# Patient Record
Sex: Male | Born: 1970
Health system: Southern US, Community
[De-identification: ages and names within clinical notes are randomized; demographics above are authoritative.]

---

## 2011-07-10 ENCOUNTER — Other Ambulatory Visit: Payer: Self-pay

## 2011-07-10 ENCOUNTER — Encounter: Payer: Self-pay | Admitting: *Deleted

## 2011-07-14 ENCOUNTER — Ambulatory Visit: Payer: Self-pay

## 2011-07-21 ENCOUNTER — Other Ambulatory Visit: Payer: Self-pay | Admitting: Lab

## 2011-07-21 ENCOUNTER — Ambulatory Visit: Payer: Self-pay | Admitting: Internal Medicine

## 2011-07-28 ENCOUNTER — Other Ambulatory Visit: Payer: Self-pay | Admitting: Lab

## 2011-07-29 ENCOUNTER — Telehealth: Payer: Self-pay | Admitting: *Deleted

## 2011-09-16 NOTE — Telephone Encounter (Signed)
Error, no note.

## 2011-12-24 ENCOUNTER — Other Ambulatory Visit: Payer: Self-pay | Admitting: *Deleted

## 2013-05-22 ENCOUNTER — Encounter (HOSPITAL_COMMUNITY): Payer: Self-pay | Admitting: Emergency Medicine

## 2013-05-22 ENCOUNTER — Emergency Department (INDEPENDENT_AMBULATORY_CARE_PROVIDER_SITE_OTHER)
Admission: EM | Admit: 2013-05-22 | Discharge: 2013-05-22 | Disposition: A | Discharge status: Home / Self Care | Payer: PRIVATE HEALTH INSURANCE | Source: Home / Self Care

## 2013-05-22 DIAGNOSIS — L508 Other urticaria: Secondary | ICD-10-CM

## 2013-05-22 MED ORDER — DEXAMETHASONE SODIUM PHOSPHATE 10 MG/ML IJ SOLN
INTRAMUSCULAR | Status: AC
  Filled 2013-05-22: qty 1

## 2013-05-22 MED ORDER — DEXAMETHASONE SODIUM PHOSPHATE 10 MG/ML IJ SOLN: 20.0000 mg | Freq: Once | INTRAMUSCULAR | Status: DC

## 2013-05-22 MED ORDER — PREDNISONE 10 MG PO TABS: ORAL_TABLET | ORAL | Status: DC

## 2013-05-22 MED ORDER — DEXAMETHASONE SODIUM PHOSPHATE 10 MG/ML IJ SOLN
10.0000 mg | Freq: Once | INTRAMUSCULAR | Status: AC
  Administered 2013-05-22: 10 mg via INTRAMUSCULAR

## 2013-05-22 NOTE — ED Provider Notes (Signed)
CSN: [id number]     Arrival date & time 05/22/13  1015 History   None    Chief Complaint  Patient presents with  . Rash   (Consider location/radiation/quality/duration/timing/severity/associated sxs/prior Treatment) HPI Comments: 42 yo male broke out with rash Friday after barber shop visit. Rash started on Right arm and has moved over most of his body. Denies SOB or difficulty swallowing. No relief with Benadryl, no other new exposures.   Patient is a 42 y.o. male presenting with rash.  Rash   History reviewed. No pertinent past medical history. History reviewed. No pertinent past surgical history. No family history on file. History  Substance Use Topics  . Smoking status: Never Smoker   . Smokeless tobacco: Not on file  . Alcohol Use: Yes    Review of Systems  Constitutional: Negative.   HENT: Negative.   Respiratory: Negative.   Cardiovascular: Negative.   Gastrointestinal: Negative.   Endocrine: Negative.   Genitourinary: Negative.   Musculoskeletal: Negative.   Skin: Positive for rash.  Allergic/Immunologic: Negative.   Psychiatric/Behavioral: Negative.     Allergies  Review of patient's allergies indicates no known allergies.  Home Medications   Current Outpatient Rx  Name  Route  Sig  Dispense  Refill  . diphenhydrAMINE (BENADRYL) 25 MG tablet   Oral   Take 25 mg by mouth every 6 (six) hours as needed for itching.         . predniSONE (DELTASONE) 10 MG tablet      1 po TID x 3 days 1 po BID x 3 days 1 po qd x 5 days   20 tablet   0    BP 145/81  Pulse 93  Temp(Src) 98.7 F (37.1 C) (Oral)  Resp 18  SpO2 94% Physical Exam  Nursing note and vitals reviewed. Constitutional: He appears well-developed and well-nourished.  Neck: Normal range of motion. Neck supple.  Cardiovascular: Normal rate, regular rhythm, normal heart sounds and intact distal pulses.   Pulmonary/Chest: Effort normal and breath sounds normal.  Skin: Skin is warm and dry.  Rash noted.  Multiple Urticaria appearing patches ranging form .5cm to 2cm over all extremities/ Abdomen/ chest/ back. Erythema with mild elevation at each location.    ED Course  Procedures (including critical care time) Labs Review Labs Reviewed - No data to display Imaging Review No results found.  MDM  Urticaria Dexamethasone 10 IM If symptoms do not resolve will start on 05/23/13 prednisone 10mg  DP AD Patient will continue either Benadryl OTC AD or start Zyrtec 10mg  AD. Patient will call 911 if symptoms increase. Advised of rash hygiene. Advised recheck of BP in 1 week if continues to be elevated needs rechecked at PCP and treated.   Berenice Primas, PA-C 05/22/13 1205  Berenice Primas, PA-C 05/22/13 1223

## 2013-05-22 NOTE — ED Notes (Signed)
Verified order with smith pa

## 2013-05-22 NOTE — ED Notes (Signed)
Rash since Friday, including extremities, red bumps that itch.  No one else at home with rash.

## 2013-05-24 NOTE — ED Provider Notes (Signed)
Medical screening examination/treatment/procedure(s) were performed by non-physician practitioner and as supervising physician I was immediately available for consultation/collaboration.   MORENO-COLL,Jaiyah Beining; MD  Jaquail Mclees Moreno-Coll, MD 05/24/13 0906 

## 2013-06-09 ENCOUNTER — Emergency Department (HOSPITAL_COMMUNITY): Payer: PRIVATE HEALTH INSURANCE | Admitting: Anesthesiology

## 2013-06-09 ENCOUNTER — Emergency Department (HOSPITAL_COMMUNITY)
Admission: EM | Admit: 2013-06-09 | Discharge: 2013-06-10 | Disposition: A | Discharge status: Home / Self Care | Payer: PRIVATE HEALTH INSURANCE | Attending: Emergency Medicine | Admitting: Emergency Medicine

## 2013-06-09 ENCOUNTER — Encounter (HOSPITAL_COMMUNITY)
Admission: EM | Disposition: A | Discharge status: Home / Self Care | Payer: Self-pay | Source: Home / Self Care | Attending: Emergency Medicine

## 2013-06-09 ENCOUNTER — Encounter (HOSPITAL_COMMUNITY): Payer: Self-pay | Admitting: Emergency Medicine

## 2013-06-09 ENCOUNTER — Encounter (HOSPITAL_COMMUNITY): Payer: PRIVATE HEALTH INSURANCE | Admitting: Anesthesiology

## 2013-06-09 DIAGNOSIS — S61012A Laceration without foreign body of left thumb without damage to nail, initial encounter: Secondary | ICD-10-CM

## 2013-06-09 DIAGNOSIS — W278XXA Contact with other nonpowered hand tool, initial encounter: Secondary | ICD-10-CM | POA: Insufficient documentation

## 2013-06-09 DIAGNOSIS — S61209A Unspecified open wound of unspecified finger without damage to nail, initial encounter: Secondary | ICD-10-CM | POA: Insufficient documentation

## 2013-06-09 DIAGNOSIS — I10 Essential (primary) hypertension: Secondary | ICD-10-CM | POA: Insufficient documentation

## 2013-06-09 HISTORY — PX: WOUND EXPLORATION: SHX6188

## 2013-06-09 HISTORY — PX: TENDON REPAIR: SHX5111

## 2013-06-09 LAB — COMPREHENSIVE METABOLIC PANEL
ALT: 32 U/L (ref 0–53)
AST: 25 U/L (ref 0–37)
Albumin: 4 g/dL (ref 3.5–5.2)
Alkaline Phosphatase: 85 U/L (ref 39–117)
BUN: 12 mg/dL (ref 6–23)
CO2: 24 mEq/L (ref 19–32)
Calcium: 8.9 mg/dL (ref 8.4–10.5)
Chloride: 103 mEq/L (ref 96–112)
Creatinine, Ser: 1.07 mg/dL (ref 0.50–1.35)
GFR calc Af Amer: >90 mL/min (ref >90)
GFR calc non Af Amer: 84 mL/min — ABNORMAL LOW (ref >90)
Glucose, Bld: 121 mg/dL — ABNORMAL HIGH (ref 70–99)
Potassium: 4.1 mEq/L (ref 3.5–5.1)
Sodium: 139 mEq/L (ref 135–145)
Total Bilirubin: 0.2 mg/dL — ABNORMAL LOW (ref 0.3–1.2)
Total Protein: 7.4 g/dL (ref 6.0–8.3)

## 2013-06-09 LAB — CBC
HCT: 42.1 % (ref 39.0–52.0)
Hemoglobin: 15.5 g/dL (ref 13.0–17.0)
MCH: 29.5 pg (ref 26.0–34.0)
MCHC: 36.8 g/dL — ABNORMAL HIGH (ref 30.0–36.0)
MCV: 80.2 fL (ref 78.0–100.0)
Platelets: 248 10*3/uL (ref 150–400)
RBC: 5.25 MIL/uL (ref 4.22–5.81)
RDW: 12.7 % (ref 11.5–15.5)
WBC: 8.7 10*3/uL (ref 4.0–10.5)

## 2013-06-09 SURGERY — TENDON REPAIR
Anesthesia: General | Site: Thumb | Laterality: Left | Wound class: Dirty or Infected

## 2013-06-09 MED ORDER — ONDANSETRON HCL 4 MG/2ML IJ SOLN
INTRAMUSCULAR | Status: DC | PRN
  Administered 2013-06-09: 4 mg via INTRAMUSCULAR

## 2013-06-09 MED ORDER — OXYCODONE-ACETAMINOPHEN 5-325 MG PO TABS: ORAL_TABLET | ORAL | Status: DC

## 2013-06-09 MED ORDER — LACTATED RINGERS IV SOLN
INTRAVENOUS | Status: DC | PRN
  Administered 2013-06-09: 22:00:00 via INTRAVENOUS

## 2013-06-09 MED ORDER — SULFAMETHOXAZOLE-TMP DS 800-160 MG PO TABS: 1.0000 | ORAL_TABLET | Freq: Two times a day (BID) | ORAL | Status: DC

## 2013-06-09 MED ORDER — BUPIVACAINE HCL (PF) 0.25 % IJ SOLN
INTRAMUSCULAR | Status: AC
  Filled 2013-06-09: qty 30

## 2013-06-09 MED ORDER — HYDROCODONE-ACETAMINOPHEN 5-325 MG PO TABS
1.0000 | ORAL_TABLET | Freq: Once | ORAL | Status: AC
  Administered 2013-06-09: 2 via ORAL
  Filled 2013-06-09: qty 2

## 2013-06-09 MED ORDER — CEFAZOLIN SODIUM-DEXTROSE 2-3 GM-% IV SOLR
INTRAVENOUS | Status: AC
  Filled 2013-06-09: qty 100

## 2013-06-09 MED ORDER — BUPIVACAINE HCL (PF) 0.25 % IJ SOLN
INTRAMUSCULAR | Status: DC | PRN
  Administered 2013-06-09: 30 mL

## 2013-06-09 MED ORDER — 0.9 % SODIUM CHLORIDE (POUR BTL) OPTIME
TOPICAL | Status: DC | PRN
  Administered 2013-06-09: 1000 mL

## 2013-06-09 MED ORDER — TETANUS-DIPHTH-ACELL PERTUSSIS 5-2.5-18.5 LF-MCG/0.5 IM SUSP
0.5000 mL | Freq: Once | INTRAMUSCULAR | Status: AC
  Administered 2013-06-09: 0.5 mL via INTRAMUSCULAR
  Filled 2013-06-09: qty 0.5

## 2013-06-09 MED ORDER — HYDROMORPHONE HCL PF 1 MG/ML IJ SOLN: 0.2500 mg | INTRAMUSCULAR | Status: DC | PRN

## 2013-06-09 MED ORDER — ARTIFICIAL TEARS OP OINT
TOPICAL_OINTMENT | OPHTHALMIC | Status: DC | PRN
  Administered 2013-06-09: 1 via OPHTHALMIC

## 2013-06-09 MED ORDER — MIDAZOLAM HCL 5 MG/5ML IJ SOLN
INTRAMUSCULAR | Status: DC | PRN
  Administered 2013-06-09: 2 mg via INTRAVENOUS

## 2013-06-09 MED ORDER — DEXTROSE 5 % IV SOLN
3.0000 g | INTRAVENOUS | Status: DC | PRN
  Administered 2013-06-09: 3 g via INTRAVENOUS

## 2013-06-09 MED ORDER — PROPOFOL 10 MG/ML IV BOLUS
INTRAVENOUS | Status: DC | PRN
  Administered 2013-06-09: 200 mg via INTRAVENOUS

## 2013-06-09 MED ORDER — FENTANYL CITRATE 0.05 MG/ML IJ SOLN
INTRAMUSCULAR | Status: DC | PRN
  Administered 2013-06-09: 50 ug via INTRAVENOUS
  Administered 2013-06-09 (×2): 100 ug via INTRAVENOUS

## 2013-06-09 MED ORDER — LIDOCAINE HCL (CARDIAC) 20 MG/ML IV SOLN
INTRAVENOUS | Status: DC | PRN
  Administered 2013-06-09: 60 mg via INTRAVENOUS

## 2013-06-09 MED ORDER — CEFAZOLIN SODIUM 1-5 GM-% IV SOLN
INTRAVENOUS | Status: AC
  Filled 2013-06-09: qty 50

## 2013-06-09 MED ORDER — SUCCINYLCHOLINE CHLORIDE 20 MG/ML IJ SOLN
INTRAMUSCULAR | Status: DC | PRN
  Administered 2013-06-09: 140 mg via INTRAVENOUS

## 2013-06-09 SURGICAL SUPPLY — 47 items
BANDAGE COBAN STERILE 2 (GAUZE/BANDAGES/DRESSINGS) IMPLANT
BANDAGE ELASTIC 3 VELCRO ST LF (GAUZE/BANDAGES/DRESSINGS) ×1 IMPLANT
BANDAGE GAUZE ELAST BULKY 4 IN (GAUZE/BANDAGES/DRESSINGS) ×1 IMPLANT
BLADE MINI RND TIP GREEN BEAV (BLADE) IMPLANT
BNDG CMPR 9X4 STRL LF SNTH (GAUZE/BANDAGES/DRESSINGS)
BNDG COHESIVE 1X5 TAN STRL LF (GAUZE/BANDAGES/DRESSINGS) IMPLANT
BNDG COHESIVE 3X5 TAN STRL LF (GAUZE/BANDAGES/DRESSINGS) IMPLANT
BNDG ESMARK 4X9 LF (GAUZE/BANDAGES/DRESSINGS) ×1 IMPLANT
CLOTH BEACON ORANGE TIMEOUT ST (SAFETY) ×1 IMPLANT
CORDS BIPOLAR (ELECTRODE) ×2 IMPLANT
CUFF TOURNIQUET SINGLE 18IN (TOURNIQUET CUFF) ×1 IMPLANT
CUFF TOURNIQUET SINGLE 24IN (TOURNIQUET CUFF) ×1 IMPLANT
DECANTER SPIKE VIAL GLASS SM (MISCELLANEOUS) IMPLANT
DRAPE OEC MINIVIEW 54X84 (DRAPES) IMPLANT
DRSG KUZMA FLUFF (GAUZE/BANDAGES/DRESSINGS) IMPLANT
DURAPREP 26ML APPLICATOR (WOUND CARE) ×1 IMPLANT
GAUZE SPONGE 2X2 8PLY STRL LF (GAUZE/BANDAGES/DRESSINGS) IMPLANT
GAUZE XEROFORM 1X8 LF (GAUZE/BANDAGES/DRESSINGS) ×2 IMPLANT
GLOVE BIO SURGEON STRL SZ7.5 (GLOVE) ×2 IMPLANT
GOWN BRE IMP PREV XXLGXLNG (GOWN DISPOSABLE) ×2 IMPLANT
GOWN PREVENTION PLUS XLARGE (GOWN DISPOSABLE) ×2 IMPLANT
GOWN STRL NON-REIN LRG LVL3 (GOWN DISPOSABLE) ×4 IMPLANT
KIT BASIN OR (CUSTOM PROCEDURE TRAY) ×2 IMPLANT
KIT ROOM TURNOVER OR (KITS) ×2 IMPLANT
MANIFOLD NEPTUNE II (INSTRUMENTS) ×1 IMPLANT
NDL HYPO 25GX1X1/2 BEV (NEEDLE) IMPLANT
NEEDLE HYPO 25GX1X1/2 BEV (NEEDLE) ×2 IMPLANT
NS IRRIG 1000ML POUR BTL (IV SOLUTION) ×2 IMPLANT
PACK ORTHO EXTREMITY (CUSTOM PROCEDURE TRAY) ×2 IMPLANT
PAD ARMBOARD 7.5X6 YLW CONV (MISCELLANEOUS) ×4 IMPLANT
PAD CAST 4YDX4 CTTN HI CHSV (CAST SUPPLIES) IMPLANT
PADDING CAST COTTON 4X4 STRL (CAST SUPPLIES)
RUBBERBAND STERILE (MISCELLANEOUS) ×1 IMPLANT
SPECIMEN JAR SMALL (MISCELLANEOUS) ×1 IMPLANT
SPONGE GAUZE 2X2 STER 10/PKG (GAUZE/BANDAGES/DRESSINGS)
SPONGE GAUZE 4X4 12PLY (GAUZE/BANDAGES/DRESSINGS) ×1 IMPLANT
SPONGE SCRUB IODOPHOR (GAUZE/BANDAGES/DRESSINGS) ×2 IMPLANT
SUT ETHIBOND 4 0 TF (SUTURE) ×1 IMPLANT
SUT ETHILON 5 0 PS 2 18 (SUTURE) IMPLANT
SUT SILK 4 0 PS 2 (SUTURE) IMPLANT
SUT VICRYL 4-0 PS2 18IN ABS (SUTURE) ×1 IMPLANT
SYR CONTROL 10ML LL (SYRINGE) ×1 IMPLANT
TOWEL OR 17X24 6PK STRL BLUE (TOWEL DISPOSABLE) ×2 IMPLANT
TOWEL OR 17X26 10 PK STRL BLUE (TOWEL DISPOSABLE) ×2 IMPLANT
TUBE FEEDING 5FR 15 INCH (TUBING) IMPLANT
UNDERPAD 30X30 INCONTINENT (UNDERPADS AND DIAPERS) ×2 IMPLANT
WATER STERILE IRR 1000ML POUR (IV SOLUTION) ×1 IMPLANT

## 2013-06-09 NOTE — ED Notes (Signed)
Pt presents to department for evaluation of L hand laceration. States he was using box cutter this afternoon when it slipped and cut hand. 2 inch laceration noted to L thumb. Bleeding controlled upon arrival to ED. 10/10 pain. Able to wiggle digits. Capillary refill less than 2 seconds. Tetanus unknown.

## 2013-06-09 NOTE — Anesthesia Preprocedure Evaluation (Addendum)
Anesthesia Evaluation  Patient identified by MRN, date of birth, ID band Patient awake  General Assessment Comment:1430  - chicken salad & 12oz tea.; 1830 @ 4oz water w/ vicodin  Reviewed: Allergy & Precautions, H&P , NPO status , Patient's Chart, lab work & pertinent test results, reviewed documented beta blocker date and time   History of Anesthesia Complications Negative for: history of anesthetic complications  Airway Mallampati: I TM Distance: >3 FB Neck ROM: Full    Dental  (+) Teeth Intact and Dental Advisory Given   Pulmonary neg pulmonary ROS,          Cardiovascular hypertension, Pt. on medications     Neuro/Psych negative neurological ROS     GI/Hepatic negative GI ROS, Neg liver ROS,   Endo/Other  Morbid obesity  Renal/GU negative Renal ROS     Musculoskeletal negative musculoskeletal ROS (+)   Abdominal   Peds  Hematology negative hematology ROS (+)   Anesthesia Other Findings   Reproductive/Obstetrics                         Anesthesia Physical Anesthesia Plan  ASA: III and emergent  Anesthesia Plan: General   Post-op Pain Management:    Induction: Intravenous  Airway Management Planned: Oral ETT  Additional Equipment:   Intra-op Plan:   Post-operative Plan: Extubation in OR  Informed Consent: I have reviewed the patients History and Physical, chart, labs and discussed the procedure including the risks, benefits and alternatives for the proposed anesthesia with the patient or authorized representative who has indicated his/her understanding and acceptance.   Dental advisory given  Plan Discussed with: Anesthesiologist, CRNA and Surgeon  Anesthesia Plan Comments:       Anesthesia Quick Evaluation

## 2013-06-09 NOTE — Op Note (Signed)
[id number] 

## 2013-06-09 NOTE — Preoperative (Signed)
Beta Blockers   Reason not to administer Beta Blockers:Not Applicable 

## 2013-06-09 NOTE — ED Notes (Signed)
Pt transported to OR Holding #[id number] via stretcher. Patient is gown and socks. Significant other remains with him and has his personal belongings.

## 2013-06-09 NOTE — Transfer of Care (Signed)
Immediate Anesthesia Transfer of Care Note  Patient: Derek Delacruz  Procedure(s) Performed: Procedure(s): Irrigation and debridement left thumb laceration (Left) WOUND EXPLORATION (Left)  Patient Location: PACU  Anesthesia Type:General  Level of Consciousness: sedated and patient cooperative  Airway & Oxygen Therapy: Patient Spontanous Breathing and Patient connected to nasal cannula oxygen  Post-op Assessment: Report given to PACU RN, Post -op Vital signs reviewed and stable and Patient moving all extremities X 4  Post vital signs: Reviewed and stable  Complications: No apparent anesthesia complications

## 2013-06-09 NOTE — ED Notes (Signed)
Laceration cleaned and covered with dressing.  

## 2013-06-09 NOTE — Anesthesia Postprocedure Evaluation (Signed)
  Anesthesia Post-op Note  Patient: Derek Delacruz  Procedure(s) Performed: Procedure(s): Irrigation and debridement left thumb laceration (Left) WOUND EXPLORATION (Left)  Patient Location: PACU  Anesthesia Type:General  Level of Consciousness: awake  Airway and Oxygen Therapy: Patient Spontanous Breathing  Post-op Pain: mild  Post-op Assessment: Post-op Vital signs reviewed  Post-op Vital Signs: Reviewed  Complications: No apparent anesthesia complications

## 2013-06-09 NOTE — ED Notes (Signed)
Dr. Merlyn Lot (ortho/hand) at bedside and has decided to take the patient to the OR to repair laceration

## 2013-06-09 NOTE — ED Provider Notes (Signed)
CSN: [id number]     Arrival date & time 06/09/13  1719 History  This chart was scribed for non-physician practitioner Coral Ceo, PA-C, working with Richardean Canal, MD by Dorothey Baseman, ED Scribe. This patient was seen in room TR06C/TR06C and the patient's care was started at [facility].    Chief Complaint  Patient presents with  . Laceration   The history is provided by the patient. No language interpreter was used.   HPI Comments: Derek Delacruz is a 42 y.o. male with no past medical history who presents to the Emergency Department complaining of a laceration to the left thumb, which occurred around 2 hours ago. Patient states that his hand slipped and he cut his left thumb using a box cutter. He denies any other injuries. Patient denies any foreign bodies. Patient has not done anything to clean the wound out prior to arrival. Patient reports associated pain to the area that is 10/10 currently. He reports that he has not taken anything at home to treat the pain. The bleeding is well-controlled at this time. He denies any numbness, tingling, loss of sensation or weakness to the area. Patient reports that he does not know when his last tetanus vaccination was. Patient is right-handed.    No past medical history on file. No past surgical history on file. History reviewed. No pertinent family history. History  Substance Use Topics  . Smoking status: Never Smoker   . Smokeless tobacco: Not on file  . Alcohol Use: Yes     Comment: social    Review of Systems  Constitutional: Negative for fever, diaphoresis, activity change, appetite change and fatigue.  HENT: Negative for drooling and mouth sores.   Eyes: Negative for visual disturbance.  Respiratory: Negative for cough and shortness of breath.   Cardiovascular: Negative for chest pain.  Gastrointestinal: Negative for nausea, vomiting and abdominal pain.  Genitourinary: Negative for dysuria.  Musculoskeletal: Negative for joint swelling.   Skin: Positive for wound. Negative for color change.  Neurological: Negative for dizziness, weakness, numbness and headaches.    Allergies  Review of patient's allergies indicates no known allergies.  Home Medications   Current Outpatient Rx  Name  Route  Sig  Dispense  Refill  . diphenhydrAMINE (BENADRYL) 25 MG tablet   Oral   Take 25 mg by mouth every 6 (six) hours as needed for itching.         . predniSONE (DELTASONE) 10 MG tablet      1 po TID x 3 days 1 po BID x 3 days 1 po qd x 5 days   20 tablet   0    Triage Vitals: BP 161/65  Pulse 81  Temp(Src) 98.1 F (36.7 C) (Oral)  Resp 18  SpO2 100%  Filed Vitals:   06/09/13 1736 06/09/13 2330 06/09/13 2345 06/10/13 0000  BP: 161/65 162/97 152/99 166/89  Pulse: 81 83 82 80  Temp: 98.1 F (36.7 C) 97 F (36.1 C)  98.2 F (36.8 C)  TempSrc: Oral     Resp: 18 12    SpO2: 100% 100% 100% 100%   Physical Exam  Nursing note and vitals reviewed. Constitutional: He is oriented to person, place, and time. He appears well-developed and well-nourished. No distress.  HENT:  Head: Normocephalic and atraumatic.  Right Ear: External ear normal.  Left Ear: External ear normal.  Nose: Nose normal.  Mouth/Throat: Oropharynx is clear and moist. No oropharyngeal exudate.  Eyes: Conjunctivae are normal. Right eye  exhibits no discharge. Left eye exhibits no discharge.  Neck: Normal range of motion. Neck supple.  Cardiovascular: Normal rate, regular rhythm and normal heart sounds.  Exam reveals no gallop and no friction rub.   No murmur heard. Radial pulses present and equal bilaterally. Capillary refill less than 2 seconds to left first phalanx.  Pulmonary/Chest: Effort normal and breath sounds normal. No respiratory distress. He has no wheezes. He has no rales. He exhibits no tenderness.  Abdominal: Soft. He exhibits no distension. There is no tenderness.  Musculoskeletal: Normal range of motion. He exhibits tenderness. He  exhibits no edema.  Diffuse tenderness to palpation to the left first phalanx throughout. Patient unable to fully flex the metacarpophalangeal or interphalangeal joint of the left first phalanx, however does have some range of motion with flexion. Patient able to fully extend MP and IP joints of left first phalanx. No limitations with range of motion of the joints of the digits 2-5 of the left hand.   Neurological: He is alert and oriented to person, place, and time.  Gross sensation intact in the upper extremities bilaterally  Skin: Skin is warm and dry. He is not diaphoretic.  5 cm linear laceration to the dorsum of the left thumb overlying the metacarpal phalangeal joint.  Laceration does not extend to the IP joint. No active bleeding. No surrounding edema or ecchymosis. No evidence of foreign body. Flexor tendon is exposed and appears to be partially lacerated however intact.   Psychiatric: He has a normal mood and affect. His behavior is normal.    ED Course  Procedures (including critical care time)  Medications  TDaP (BOOSTRIX) injection 0.5 mL (0.5 mLs Intramuscular Given 06/09/13 1823)  HYDROcodone-acetaminophen (NORCO/VICODIN) 5-325 MG per tablet 1-2 tablet (2 tablets Oral Given 06/09/13 1823)    DIAGNOSTIC STUDIES: Oxygen Saturation is 100% on room air, normal by my interpretation.    COORDINATION OF CARE: 6:14PM- Will order a tetanus vaccination and pain medication. Discussed treatment plan with patient at bedside and patient verbalized agreement.   8:17PM- Consulted with hand surgeon, Dr. Merlyn Lot, and we both agreed that the patient will require surgery. Discussed treatment plan with patient at bedside and patient verbalized agreement.    Labs Review Labs Reviewed - No data to display Imaging Review No results found.  EKG Interpretation   None      Results for orders placed during the hospital encounter of 06/09/13  CBC      Result Value Range   WBC 8.7  4.0 - 10.5  K/uL   RBC 5.25  4.22 - 5.81 MIL/uL   Hemoglobin 15.5  13.0 - 17.0 g/dL   HCT 16.1  09.6 - 04.5 %   MCV 80.2  78.0 - 100.0 fL   MCH 29.5  26.0 - 34.0 pg   MCHC 36.8 (*) 30.0 - 36.0 g/dL   RDW 40.9  81.1 - 91.4 %   Platelets 248  150 - 400 K/uL  COMPREHENSIVE METABOLIC PANEL      Result Value Range   Sodium 139  135 - 145 mEq/L   Potassium 4.1  3.5 - 5.1 mEq/L   Chloride 103  96 - 112 mEq/L   CO2 24  19 - 32 mEq/L   Glucose, Bld 121 (*) 70 - 99 mg/dL   BUN 12  6 - 23 mg/dL   Creatinine, Ser 7.82  0.50 - 1.35 mg/dL   Calcium 8.9  8.4 - 95.6 mg/dL   Total Protein 7.4  6.0 - 8.3 g/dL   Albumin 4.0  3.5 - 5.2 g/dL   AST 25  0 - 37 U/L   ALT 32  0 - 53 U/L   Alkaline Phosphatase 85  39 - 117 U/L   Total Bilirubin 0.2 (*) 0.3 - 1.2 mg/dL   GFR calc non Af Amer 84 (*) >90 mL/min   GFR calc Af Amer >90  >90 mL/min     MDM   1. Laceration of thumb, left, with tendon involvement, initial encounter     Derek Delacruz is a 42 y.o. male with no past medical history who presents to the Emergency Department complaining of a laceration to the left thumb, which occurred around 2 hours ago.  Vicodin ordered for pain. Tetanus booster given.    Consults  8:03 PM = Spoke with Dr. Merlyn Lot who is coming to evaluate the patient. CBC and CMP ordered.    Patient was evaluated in emergency department for a laceration. Hand surgery was consult in who evaluated the patient in the emergency room. Dr. Merlyn Lot with hand surgery felt surgical washout and further exploration was necessary. Patient was taken to the OR for further evaluation and management. Patient was neurovascularly intact, however did have some limitations with range of motion. There was possible evidence of tendon involvement on exam. Patient was in agreement with plan.    Final impressions: 1. Laceration of left thumb with tendon involvement.    Luiz Iron PA-C   This patient was discussed with Dr. Silverio Lay  I personally  performed the services described in this documentation, which was scribed in my presence. The recorded information has been reviewed and is accurate.        Jillyn Ledger, PA-C 06/10/13 [phone]

## 2013-06-09 NOTE — H&P (Signed)
Derek Delacruz is an 42 y.o. male.   Chief Complaint: left thumb laceration HPI: 42 yo rhd male states he lacerated left thumb with a box cutter this afternoon.  Seen at [facility] and felt to have tendon injury.  Reports no previous injury to thumb and no other injury at this time.  No past medical history on file.  No past surgical history on file.  History reviewed. No pertinent family history. Social History:  reports that he has never smoked. He does not have any smokeless tobacco history on file. He reports that he drinks alcohol. He reports that he does not use illicit drugs.  Allergies: No Known Allergies   (Not in a hospital admission)  No results found for this or any previous visit (from the past 48 hour(s)).  No results found.   A comprehensive review of systems was negative.  Blood pressure 161/65, pulse 81, temperature 98.1 F (36.7 C), temperature source Oral, resp. rate 18, SpO2 100.00%.  General appearance: alert, cooperative and appears stated age Head: Normocephalic, without obvious abnormality, atraumatic Neck: supple, symmetrical, trachea midline Resp: clear to auscultation bilaterally Cardio: regular rate and rhythm GI: non tender Extremities: light touch sensation and capillary refill intact all digits.  +epl/fpl/io.  laceration dorsally over mp joint left thumb.  visible tendon.  longitudinal laceration.   Pulses: 2+ and symmetric Skin: Skin color, texture, turgor normal. No rashes or lesions Neurologic: Grossly normal Incision/Wound: As above  Assessment/Plan Left thumb laceration.  Non operative and operative treatment options were discussed with the patient and patient wishes to proceed with operative treatment. Decision made for exploration of wound in OR with repair of tendon and irrigation of joint as necessary.  Risks, benefits, and alternatives of surgery were discussed and the patient agrees with the plan of care.   Derek Delacruz R 06/09/2013, 8:33  PM

## 2013-06-09 NOTE — Brief Op Note (Signed)
06/09/2013  11:21 PM  PATIENT:  Derek Delacruz  42 y.o. male  PRE-OPERATIVE DIAGNOSIS:  Left Thumb Laceration  POST-OPERATIVE DIAGNOSIS:  Left Thumb Laceration  PROCEDURE:  Procedure(s): Irrigation and debridement left thumb laceration WOUND EXPLORATION  SURGEON:  Surgeon(s): Tami Ribas, MD  PHYSICIAN ASSISTANT:   ASSISTANTS: none   ANESTHESIA:   general  EBL:  Total I/O In: 700 [I.V.:700] Out: -   DRAINS: none   LOCAL MEDICATIONS USED:  MARCAINE     SPECIMEN:  No Specimen  DISPOSITION OF SPECIMEN:  N/A  COUNTS:  YES  TOURNIQUET:  27 minutes at 250 mmHg  DICTATION: .Other Dictation: Dictation Number [id number]  PLAN OF CARE: Discharge to home after PACU

## 2013-06-10 ENCOUNTER — Encounter (HOSPITAL_COMMUNITY): Payer: Self-pay | Admitting: Orthopedic Surgery

## 2013-06-10 NOTE — Op Note (Signed)
NAME:  SOHAN, POTVIN NO.:  [id number]  MEDICAL RECORD NO.:  [id number]  LOCATION:  [facility]                         FACILITY:  [facility]  PHYSICIAN:  Betha Loa, MD        DATE OF BIRTH:  07/02/1971  DATE OF PROCEDURE:  06/09/2013 DATE OF DISCHARGE:  06/10/2013                              OPERATIVE REPORT   PREOPERATIVE DIAGNOSIS:  Left thumb laceration with possible extensor tendon laceration.  POSTOPERATIVE DIAGNOSIS:  Left thumb laceration with extensor pollicis longus longitudinal laceration.  PROCEDURES: 1. Left thumb exploration of traumatic wound. 2. Irrigation and debridement of wound and metacarpophalangeal joint. 3. Repair of extensor pollicis longus tendon.  SURGEON:  Betha Loa, MD  ASSISTANT:  None.  ANESTHESIA:  General.  IV FLUIDS:  Per Anesthesia flow sheet.  ESTIMATED BLOOD LOSS:  Minimal.  COMPLICATIONS:  None.  SPECIMENS:  None.  TOURNIQUET TIME:  27 minutes.  DISPOSITION:  Stable to PACU.  INDICATIONS:  Mr. Derek Delacruz is a 42 year old right-hand dominant male who states he lacerated his left thumb with a box cutter earlier this evening.  He presented to [facility] Emergency Department where he was evaluated.  He was felt to have an extensor tendon laceration.  I was consulted for management of injury.  On examination, he had intact sensation and capillary refill in his thumb tip.  He can flex and extend the IP joint of thumb.  He had longitudinal laceration over the MP joint and metacarpal of the thumb.  I discussed with Mr. Lienhard the nature of the injury.  I recommended going to the operating room for irrigation and debridement of the wound with exploration, possible tendon repair as necessary.  Risks, benefits, and alternatives of surgery were discussed including risk of blood loss; infection; damage to nerves, vessels, tendons, ligaments, bone; failure of surgery; need for additional surgery; complications with the  wound healing; continued pain; and stiffness.  He voiced understanding of these risks and elected to proceed.  OPERATIVE COURSE:  After being identified preoperatively by myself, the patient and I agreed upon procedure and site of the procedure.  Surgical site was marked.  The risks, benefits, and alternatives of surgery were reviewed and he wished to proceed.  Surgical consent had been signed. He was given IV Ancef as preoperative antibiotic prophylaxis.  He was transported to the operating room and placed on the operating room table in supine position with the left upper extremity on an arm board. General anesthesia was induced by anesthesiologist.  Left upper extremity was prepped and draped in normal sterile orthopedic fashion. Surgical pause was performed between surgeons, Anesthesia, and operating staff, and all were in agreement as to the patient, procedure, and site of the procedure.  Tourniquet at the proximal aspect of the extremity was inflated to 250 mmHg after exsanguination of the limb with an Esmarch bandage.  The wound was explored.  Bipolar electrocautery was used to obtain hemostasis.  There was a longitudinal laceration along the radial border of the EPL tendon.  This allowed it to subluxate ulnarly.  There was laceration through the periosteum over the metacarpal.  The MP joint was exposed.  There was no  gross contamination.  The wound was copiously irrigated with 1000 mL of sterile saline by bulb syringe.  An Angiocath needle was used to irrigate the MP joint.  A 4-0 Vicryl suture was used in a running fashion to repair the periosteum over top of the metacarpal.  A 4-0 Ethibond suture was used in a running fashion to repair the EPL tendon.  There was a communication between the EPB and EPL tendon which was repaired as well.  The skin was closed with 4-0 nylon in a horizontal mattress fashion.  The wound was injected with 6 mL of 0.25% plain Marcaine to aid in  postoperative analgesia.  It was then dressed with sterile Xeroform, 4x4s, and wrapped with Kerlix.  A thumb spica splint was placed and wrapped with Kerlix and Ace bandage. Tourniquet was deflated at 27 minutes.  Fingertips were pink with brisk capillary refill after deflation of tourniquet.  Operative drapes were broken down and the patient was awoken from anesthesia safely.  He was transferred back to the stretcher and taken to the PACU in stable condition.  I will see him back in the office in 1 week for postoperative followup.  I will give him Percocet 5/325, 1 to 2 p.o. q.6 hours p.r.n. pain, dispensed #40, and Bactrim DS 1 p.o. b.i.d. x7 days to cover for the open wound.     Betha Loa, MD     KK/MEDQ  D:  06/09/2013  T:  06/10/2013  Job:  [id number]

## 2013-06-10 NOTE — ED Provider Notes (Signed)
Medical screening examination/treatment/procedure(s) were conducted as a shared visit with non-physician practitioner(s) and myself.  I personally evaluated the patient during the encounter  Derek Delacruz is a 42 y.o. male here with L thumb injury with box cutter. Tetanus given in ED. After irrigation, I examined the wound and saw some tendon injury along the lateral aspect of the thumb. He is able to adduct the thumb with some difficulty. Dr. Merlyn Lot called by PA and took him to the OR for wash out and tendon repair.    Richardean Canal, MD 06/10/13 [phone]

## 2014-04-28 ENCOUNTER — Other Ambulatory Visit: Payer: Self-pay | Admitting: Family Medicine

## 2014-04-28 DIAGNOSIS — M25561 Pain in right knee: Secondary | ICD-10-CM

## 2014-05-02 ENCOUNTER — Ambulatory Visit
Admission: RE | Admit: 2014-05-02 | Discharge: 2014-05-02 | Disposition: A | Discharge status: Home / Self Care | Payer: BC Managed Care – PPO | Source: Ambulatory Visit | Attending: Family Medicine | Admitting: Family Medicine

## 2014-05-02 DIAGNOSIS — M25561 Pain in right knee: Secondary | ICD-10-CM

## 2017-02-04 DIAGNOSIS — M7751 Other enthesopathy of right foot: Secondary | ICD-10-CM | POA: Diagnosis not present

## 2017-02-04 DIAGNOSIS — M659 Synovitis and tenosynovitis, unspecified: Secondary | ICD-10-CM | POA: Diagnosis not present

## 2017-07-01 DIAGNOSIS — M150 Primary generalized (osteo)arthritis: Secondary | ICD-10-CM | POA: Diagnosis not present

## 2017-07-01 DIAGNOSIS — M25562 Pain in left knee: Secondary | ICD-10-CM | POA: Diagnosis not present

## 2017-08-19 DIAGNOSIS — M25561 Pain in right knee: Secondary | ICD-10-CM | POA: Diagnosis not present

## 2017-08-19 DIAGNOSIS — R21 Rash and other nonspecific skin eruption: Secondary | ICD-10-CM | POA: Diagnosis not present

## 2017-08-19 DIAGNOSIS — M170 Bilateral primary osteoarthritis of knee: Secondary | ICD-10-CM | POA: Diagnosis not present

## 2017-09-09 DIAGNOSIS — M170 Bilateral primary osteoarthritis of knee: Secondary | ICD-10-CM | POA: Diagnosis not present

## 2017-09-09 DIAGNOSIS — M25562 Pain in left knee: Secondary | ICD-10-CM | POA: Diagnosis not present

## 2017-09-09 DIAGNOSIS — R21 Rash and other nonspecific skin eruption: Secondary | ICD-10-CM | POA: Diagnosis not present

## 2017-09-09 DIAGNOSIS — M25561 Pain in right knee: Secondary | ICD-10-CM | POA: Diagnosis not present

## 2017-10-12 DIAGNOSIS — M659 Synovitis and tenosynovitis, unspecified: Secondary | ICD-10-CM | POA: Diagnosis not present

## 2017-10-12 DIAGNOSIS — M71571 Other bursitis, not elsewhere classified, right ankle and foot: Secondary | ICD-10-CM | POA: Diagnosis not present

## 2017-10-12 DIAGNOSIS — M7731 Calcaneal spur, right foot: Secondary | ICD-10-CM | POA: Diagnosis not present

## 2017-10-12 DIAGNOSIS — M722 Plantar fascial fibromatosis: Secondary | ICD-10-CM | POA: Diagnosis not present

## 2017-10-19 DIAGNOSIS — R05 Cough: Secondary | ICD-10-CM | POA: Diagnosis not present

## 2017-10-19 DIAGNOSIS — J111 Influenza due to unidentified influenza virus with other respiratory manifestations: Secondary | ICD-10-CM | POA: Diagnosis not present

## 2017-10-19 DIAGNOSIS — M791 Myalgia, unspecified site: Secondary | ICD-10-CM | POA: Diagnosis not present

## 2017-10-19 DIAGNOSIS — R5383 Other fatigue: Secondary | ICD-10-CM | POA: Diagnosis not present

## 2017-11-02 ENCOUNTER — Ambulatory Visit
Admission: RE | Admit: 2017-11-02 | Discharge: 2017-11-02 | Disposition: A | Discharge status: Home / Self Care | Payer: Worker's Compensation | Source: Ambulatory Visit | Attending: Nurse Practitioner | Admitting: Nurse Practitioner

## 2017-11-02 ENCOUNTER — Other Ambulatory Visit: Payer: Self-pay | Admitting: Nurse Practitioner

## 2017-11-02 DIAGNOSIS — M79671 Pain in right foot: Secondary | ICD-10-CM

## 2017-12-09 DIAGNOSIS — I10 Essential (primary) hypertension: Secondary | ICD-10-CM | POA: Diagnosis not present

## 2017-12-09 DIAGNOSIS — R7303 Prediabetes: Secondary | ICD-10-CM | POA: Diagnosis not present

## 2017-12-09 DIAGNOSIS — E782 Mixed hyperlipidemia: Secondary | ICD-10-CM | POA: Diagnosis not present

## 2017-12-09 DIAGNOSIS — Z0000 Encounter for general adult medical examination without abnormal findings: Secondary | ICD-10-CM | POA: Diagnosis not present

## 2017-12-29 DIAGNOSIS — M71571 Other bursitis, not elsewhere classified, right ankle and foot: Secondary | ICD-10-CM | POA: Diagnosis not present

## 2017-12-29 DIAGNOSIS — M7661 Achilles tendinitis, right leg: Secondary | ICD-10-CM | POA: Diagnosis not present

## 2018-05-11 DIAGNOSIS — M7731 Calcaneal spur, right foot: Secondary | ICD-10-CM | POA: Diagnosis not present

## 2018-05-11 DIAGNOSIS — M722 Plantar fascial fibromatosis: Secondary | ICD-10-CM | POA: Diagnosis not present

## 2018-07-09 DIAGNOSIS — M7661 Achilles tendinitis, right leg: Secondary | ICD-10-CM | POA: Diagnosis not present

## 2018-07-09 DIAGNOSIS — M71571 Other bursitis, not elsewhere classified, right ankle and foot: Secondary | ICD-10-CM | POA: Diagnosis not present

## 2018-07-09 DIAGNOSIS — M19071 Primary osteoarthritis, right ankle and foot: Secondary | ICD-10-CM | POA: Diagnosis not present

## 2018-09-24 IMAGING — CR DG FOOT COMPLETE 3+V*R*
3 series · 3 of 3 positions shown · non-contrast
Comparison: None.

CLINICAL DATA: Inversion twist of RIGHT foot 3 days ago / initial
VITALIN / altered gait today / pain across dorsal forefoot and medial
aspect / concern for FX / jdh 315

EXAM:
RIGHT FOOT COMPLETE - 3+ VIEW

[x foot ap right]
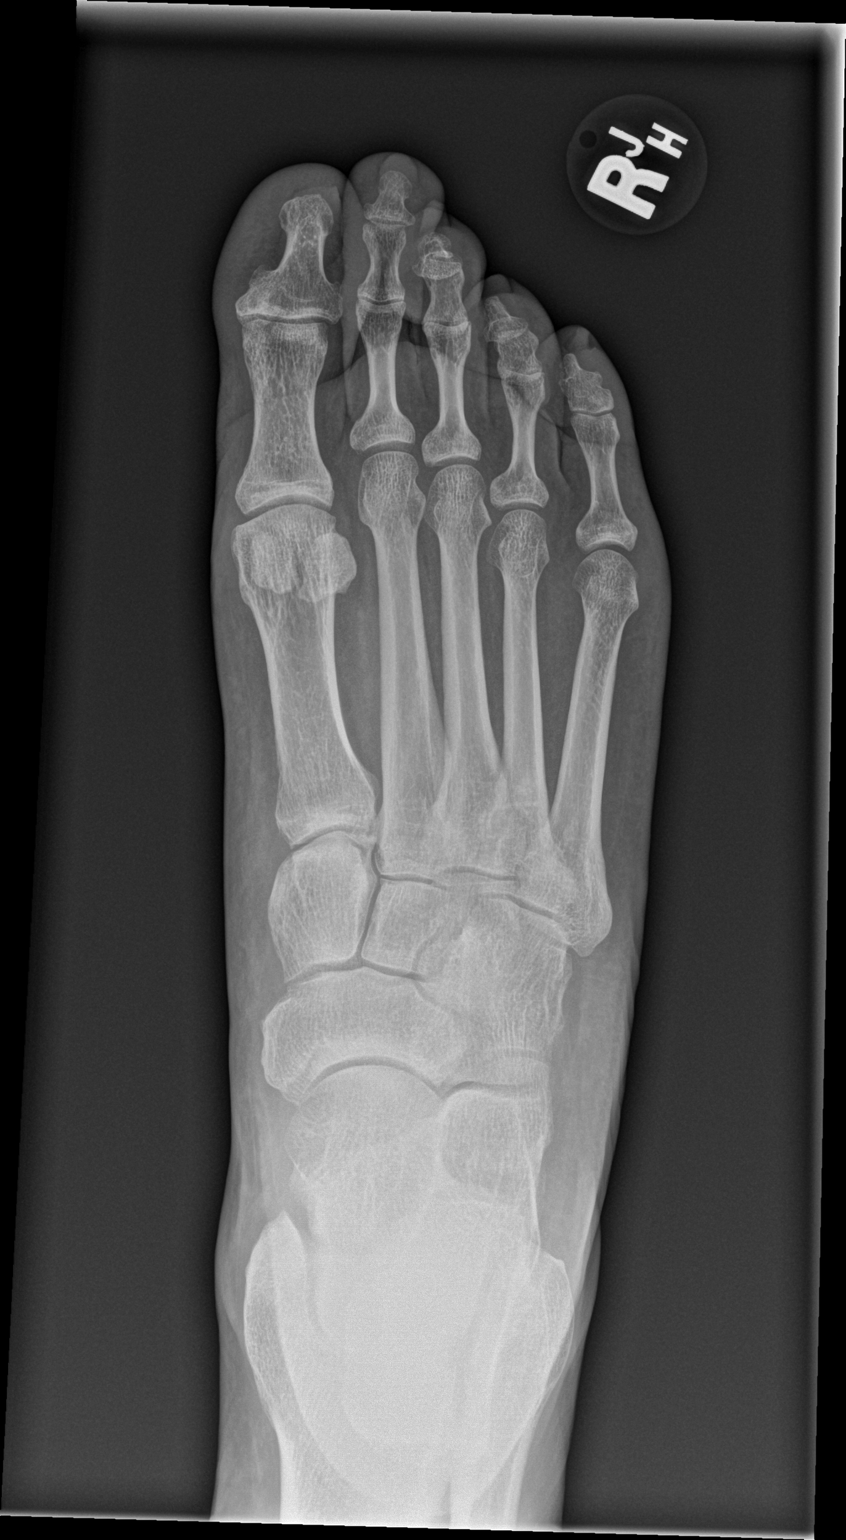

[x foot obl right]
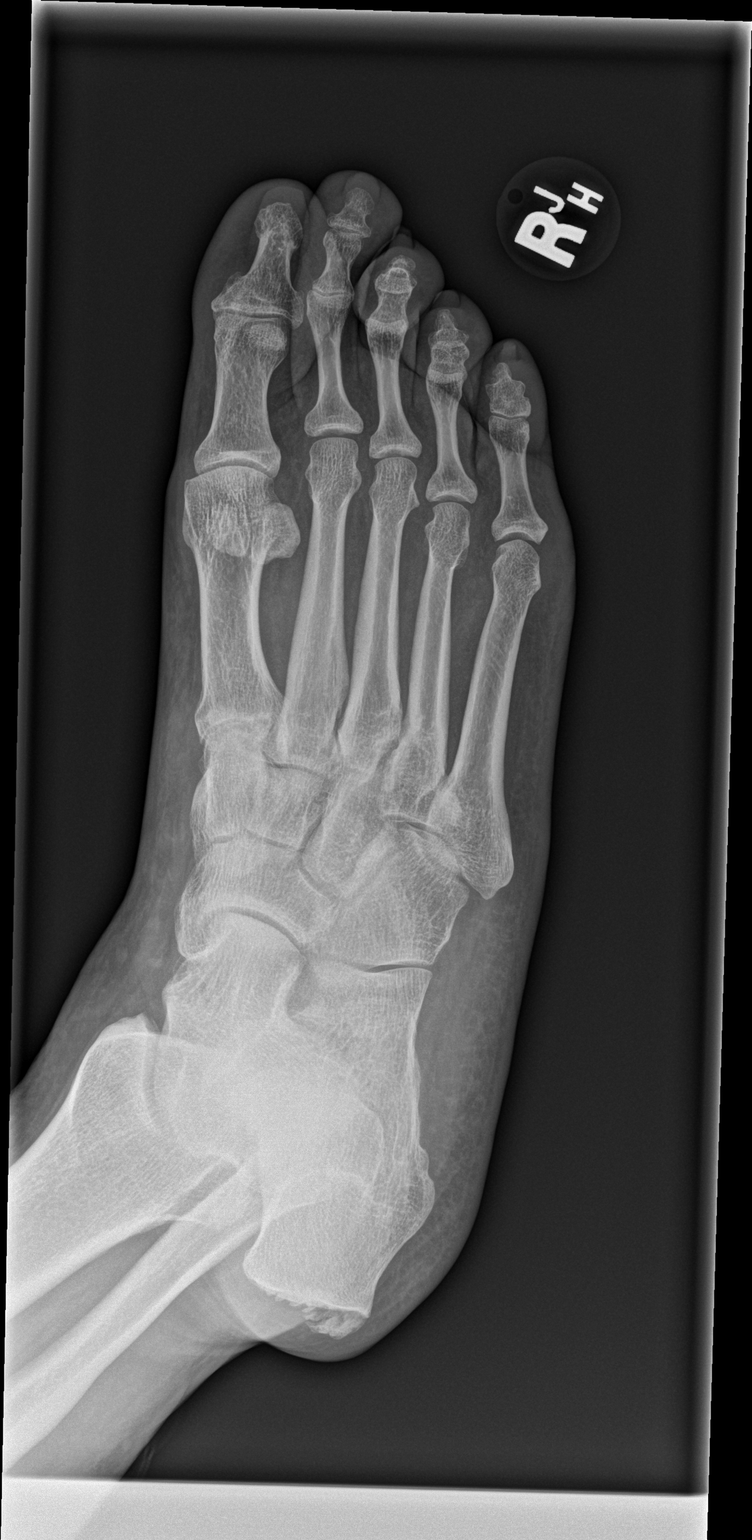

[x foot lat right]
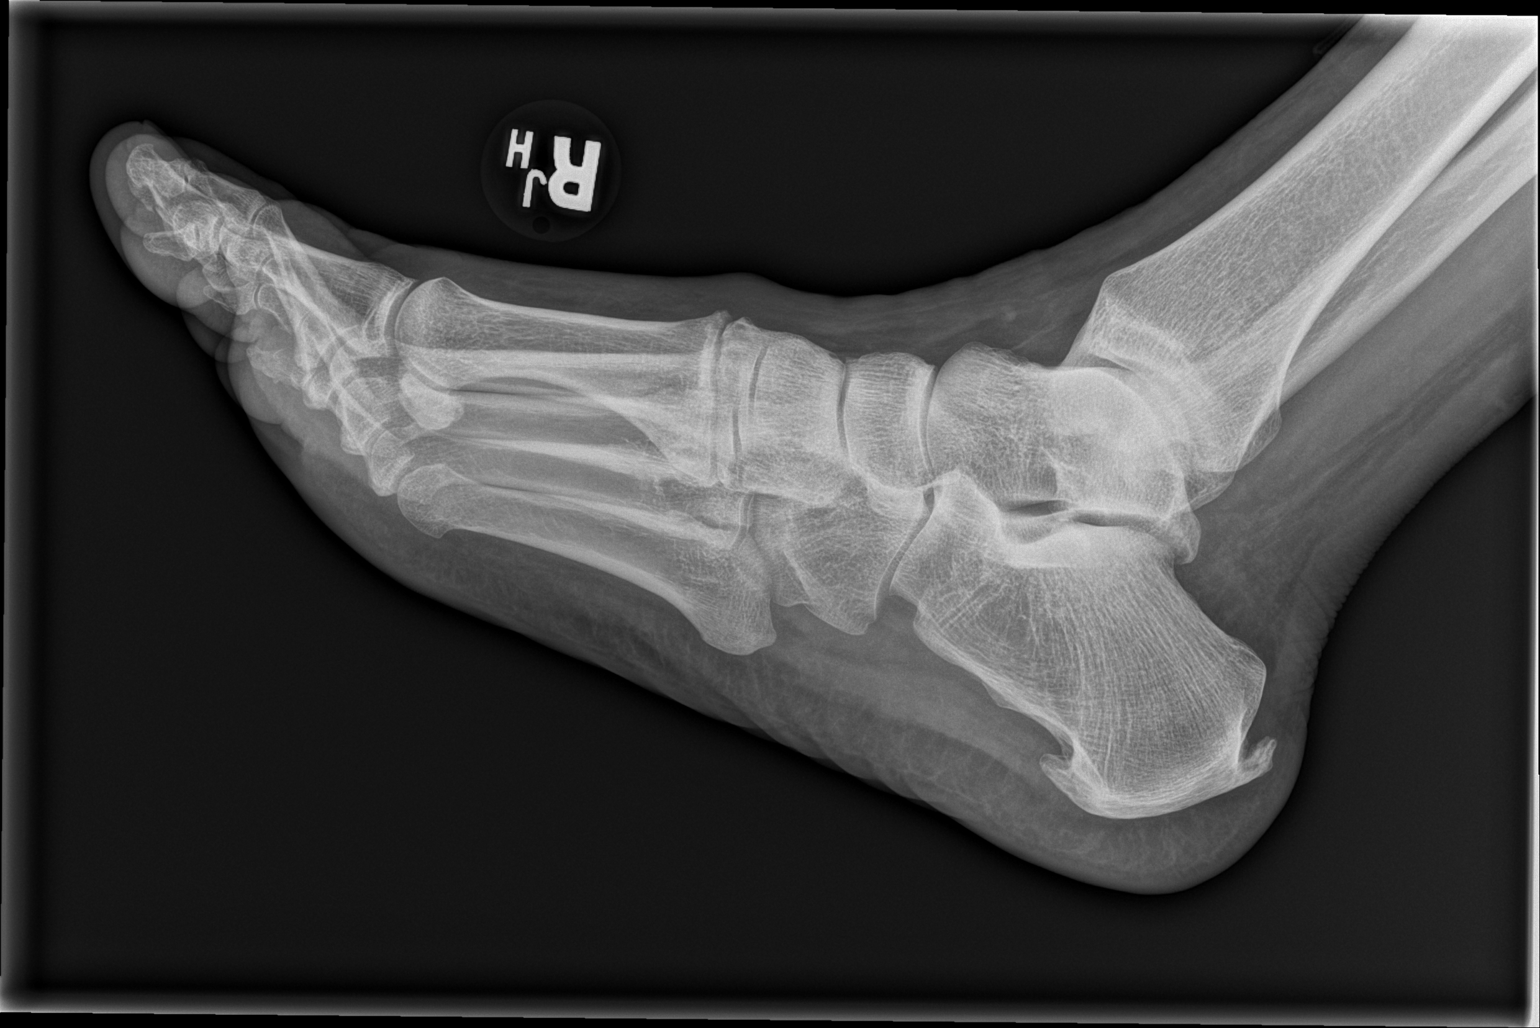

[3 of 3 positions shown; findings below may reference images not displayed]

FINDINGS: No fracture.  No bone lesion.

Mild degenerative changes at the medial cuneiform first metatarsal
articulation with minimal osteoarthritis at the first
metatarsophalangeal joint. Remaining joints normally spaced and
aligned.

There are moderate-sized plantar and dorsal calcaneal spurs.

Soft tissues are unremarkable.
IMPRESSION: 1. No fracture or acute finding.

## 2018-09-29 DIAGNOSIS — M25562 Pain in left knee: Secondary | ICD-10-CM | POA: Diagnosis not present

## 2018-09-29 DIAGNOSIS — M546 Pain in thoracic spine: Secondary | ICD-10-CM | POA: Diagnosis not present

## 2018-10-12 ENCOUNTER — Other Ambulatory Visit: Payer: Self-pay | Admitting: Nurse Practitioner

## 2018-10-12 ENCOUNTER — Ambulatory Visit
Admission: RE | Admit: 2018-10-12 | Discharge: 2018-10-12 | Disposition: A | Discharge status: Home / Self Care | Payer: No Typology Code available for payment source | Source: Ambulatory Visit | Attending: Nurse Practitioner | Admitting: Nurse Practitioner

## 2018-10-12 DIAGNOSIS — M25562 Pain in left knee: Secondary | ICD-10-CM

## 2018-10-19 DIAGNOSIS — M25561 Pain in right knee: Secondary | ICD-10-CM | POA: Diagnosis not present

## 2018-10-19 DIAGNOSIS — R21 Rash and other nonspecific skin eruption: Secondary | ICD-10-CM | POA: Diagnosis not present

## 2018-10-19 DIAGNOSIS — M170 Bilateral primary osteoarthritis of knee: Secondary | ICD-10-CM | POA: Diagnosis not present

## 2019-03-24 ENCOUNTER — Other Ambulatory Visit: Payer: Self-pay | Admitting: Internal Medicine

## 2019-03-24 DIAGNOSIS — E049 Nontoxic goiter, unspecified: Secondary | ICD-10-CM

## 2019-03-30 ENCOUNTER — Ambulatory Visit
Admission: RE | Admit: 2019-03-30 | Discharge: 2019-03-30 | Disposition: A | Discharge status: Home / Self Care | Payer: 59 | Source: Ambulatory Visit | Attending: Internal Medicine | Admitting: Internal Medicine

## 2019-03-30 DIAGNOSIS — E049 Nontoxic goiter, unspecified: Secondary | ICD-10-CM

## 2019-05-11 ENCOUNTER — Ambulatory Visit (INDEPENDENT_AMBULATORY_CARE_PROVIDER_SITE_OTHER): Payer: 59

## 2019-05-11 ENCOUNTER — Other Ambulatory Visit: Payer: Self-pay

## 2019-05-11 ENCOUNTER — Encounter: Payer: Self-pay | Admitting: Podiatry

## 2019-05-11 ENCOUNTER — Other Ambulatory Visit: Payer: Self-pay | Admitting: Podiatry

## 2019-05-11 ENCOUNTER — Ambulatory Visit (INDEPENDENT_AMBULATORY_CARE_PROVIDER_SITE_OTHER): Payer: 59 | Admitting: Podiatry

## 2019-05-11 DIAGNOSIS — M7731 Calcaneal spur, right foot: Secondary | ICD-10-CM

## 2019-05-11 DIAGNOSIS — M7661 Achilles tendinitis, right leg: Secondary | ICD-10-CM | POA: Diagnosis not present

## 2019-05-11 DIAGNOSIS — G5791 Unspecified mononeuropathy of right lower limb: Secondary | ICD-10-CM

## 2019-05-11 DIAGNOSIS — M7751 Other enthesopathy of right foot: Secondary | ICD-10-CM | POA: Diagnosis not present

## 2019-05-11 DIAGNOSIS — M7750 Other enthesopathy of unspecified foot: Secondary | ICD-10-CM

## 2019-05-11 DIAGNOSIS — M7752 Other enthesopathy of left foot: Secondary | ICD-10-CM

## 2019-05-11 DIAGNOSIS — M79672 Pain in left foot: Secondary | ICD-10-CM

## 2019-05-11 DIAGNOSIS — M722 Plantar fascial fibromatosis: Secondary | ICD-10-CM

## 2019-05-11 NOTE — Patient Instructions (Signed)
Pre-Operative Instructions  Congratulations, you have decided to take an important step towards improving your quality of life.  You can be assured that the doctors and staff at [facility] will be with you every step of the way.  Here are some important things you should know:  1. Plan to be at the surgery center/hospital at least 1 (one) hour prior to your scheduled time, unless otherwise directed by the surgical center/hospital staff.  You must have a responsible adult accompany you, remain during the surgery and drive you home.  Make sure you have directions to the surgical center/hospital to ensure you arrive on time. 2. If you are having surgery at [facility] or [place] hospitals, you will need a copy of your medical history and physical form from your family physician within one month prior to the date of surgery. We will give you a form for your primary physician to complete.  3. We make every effort to accommodate the date you request for surgery.  However, there are times where surgery dates or times have to be moved.  We will contact you as soon as possible if a change in schedule is required.   4. No aspirin/ibuprofen for one week before surgery.  If you are on aspirin, any non-steroidal anti-inflammatory medications (Mobic, Aleve, Ibuprofen) should not be taken seven (7) days prior to your surgery.  You make take Tylenol for pain prior to surgery.  5. Medications - If you are taking daily heart and blood pressure medications, seizure, reflux, allergy, asthma, anxiety, pain or diabetes medications, make sure you notify the surgery center/hospital before the day of surgery so they can tell you which medications you should take or avoid the day of surgery. 6. No food or drink after midnight the night before surgery unless directed otherwise by surgical center/hospital staff. 7. No alcoholic beverages 24-hours prior to surgery.  No smoking 24-hours prior or 24-hours after  surgery. 8. Wear loose pants or shorts. They should be loose enough to fit over bandages, boots, and casts. 9. Don't wear slip-on shoes. Sneakers are preferred. 10. Bring your boot with you to the surgery center/hospital.  Also bring crutches or a walker if your physician has prescribed it for you.  If you do not have this equipment, it will be provided for you after surgery. 11. If you have not been contacted by the surgery center/hospital by the day before your surgery, call to confirm the date and time of your surgery. 12. Leave-time from work may vary depending on the type of surgery you have.  Appropriate arrangements should be made prior to surgery with your employer. 13. Prescriptions will be provided immediately following surgery by your doctor.  Fill these as soon as possible after surgery and take the medication as directed. Pain medications will not be refilled on weekends and must be approved by the doctor. 14. Remove nail polish on the operative foot and avoid getting pedicures prior to surgery. 15. Wash the night before surgery.  The night before surgery wash the foot and leg well with water and the antibacterial soap provided. Be sure to pay special attention to beneath the toenails and in between the toes.  Wash for at least three (3) minutes. Rinse thoroughly with water and dry well with a towel.  Perform this wash unless told not to do so by your physician.  Enclosed: 1 Ice pack (please put in freezer the night before surgery)   1 Hibiclens skin cleaner     Pre-op instructions  If you have any questions regarding the instructions, please do not hesitate to call our office.  [place]: [address], [place], [place] [address] -- [phone]  [place]: [address] [place], [place] [address] -- [phone]  [place]: [address]  , [place] [address] -- [phone]  [place]: [address], [address], [place], [place] [address] -- [phone]  Website:  [email or web address] 

## 2019-05-14 NOTE — Progress Notes (Signed)
   HPI: male 48 y.o. presenting today as a new patient, referred by Dr. Gershon Mussel, with a chief complaint of constant bilateral posterior heel pain, right worse than left, that began over 8 years ago. He also notes pain to the right dorsal foot that began several months ago. He denies any modifying factors and has been taking OTC pain medication for treatment. Patient is here for further evaluation and treatment.   No past medical history on file.    Physical Exam: General: The patient is alert and oriented x3 in no acute distress.  Dermatology: Skin is warm, dry and supple bilateral lower extremities. Negative for open lesions or macerations.  Vascular: Palpable pedal pulses bilaterally. No edema or erythema noted. Capillary refill within normal limits.  Neurological: Positive Tinel sign with percussion of the DMCN right.   Musculoskeletal Exam: Pain on palpation noted to the posterior tubercle of the right calcaneus at the insertion of the Achilles tendon consistent with retrocalcaneal bursitis. Pain with palpation to prominent exostosis of the 1st met-cuneiform. Range of motion within normal limits. Muscle strength 5/5 in all muscle groups bilateral lower extremities.  Radiographic Exam:  Posterior calcaneal spur noted to the respective calcaneus on lateral view. No fracture or dislocation noted. Normal osseous mineralization noted.     Assessment: 1. Achilles tendinitis right  2. Posterior heel spur right 3. 1st met-cuneiform exostosis right 4. Neuritis DMCN right    Plan of Care:  1. Patient was evaluated. Radiographs were reviewed today. 2. Today we discussed the conservative versus surgical management of the presenting pathology. The patient opts for surgical management. All possible complications and details of the procedure were explained. All patient questions were answered. No guarantees were expressed or implied. 3. Authorization for surgery was initiated today. Surgery will  consist of retrocalcaneal exostectomy right; repair of Achilles right; 1st met-cuneiform exostectomy right; neurolysis DMCN right.  4. Return to clinic one week post op.  Residential garbage truck driver.    Derek Delacruz, DPM [facility]  Dr. Edrick Delacruz, [place]                                        [place], [place] [address]                Office [phone]  Fax [phone]

## 2019-06-03 DIAGNOSIS — M79676 Pain in unspecified toe(s): Secondary | ICD-10-CM

## 2019-06-08 ENCOUNTER — Telehealth: Payer: Self-pay | Admitting: *Deleted

## 2019-06-08 NOTE — Telephone Encounter (Signed)
Called and spoke with Lacretia Nicks at [facility] and the representative stated that the procedure codes 64704,27650,28118,28104 does not require pre-cert or prior authorization and the reference number is [id number] and to attach the medical notes when sending over the claims. Lattie Haw

## 2019-06-15 ENCOUNTER — Other Ambulatory Visit: Payer: Self-pay | Admitting: *Deleted

## 2019-06-15 DIAGNOSIS — M7661 Achilles tendinitis, right leg: Secondary | ICD-10-CM

## 2019-06-15 DIAGNOSIS — M7731 Calcaneal spur, right foot: Secondary | ICD-10-CM

## 2019-06-15 NOTE — Addendum Note (Signed)
Addended by: Lolita Rieger on: 06/15/2019 04:26 PM   Modules accepted: Orders

## 2019-06-15 NOTE — Progress Notes (Signed)
I requested assistance in getting a knee scooter from Jolee Ewing with [employer] for Mr. Honn.  He's scheduled for surgery on 07/07/2019.

## 2019-06-17 ENCOUNTER — Telehealth: Payer: Self-pay | Admitting: *Deleted

## 2019-06-17 NOTE — Telephone Encounter (Signed)
DOS 07/07/2019 REPAIR ACHILLES - 27650, CALCANEAL OSTECTOMY - [id number], TARSAL EXOSTECTOMY X 2 - 28104, AND NEUROPLASTY OF DIGITAL NERVE - 44818 OF THE RIGHT FOOT  [employer]: Eligibility Date - 10/02/2018 - 09/01/2019  Plan Deductible Per Calendar Year $0.00 of $500.00 Met Remaining: $500.00  Out-of-Pocket Maximum Per Calendar Year $210.83 of $3,000.00 Met Remaining: $2,789.17  Co-Insurance 20%  Professional Fees for Surgical and Medical Services Outpatient Professional/ Non preventive Scopic procedures: 80% of eligible expenses after satisfying $500 deductible   This [employer] plan does not currently require a prior authorization for these services. If you have general questions about the prior authorization requirements, please call us at [phone] or visit [email or web address] > Clinician Resources > Advance and Admission Notification Requirements. The number above acknowledges your notification. Please write this number down for future reference. Notification is not a guarantee of coverage or payment.  Decision ID #:[id number]

## 2019-07-07 ENCOUNTER — Other Ambulatory Visit: Payer: Self-pay | Admitting: Podiatry

## 2019-07-07 DIAGNOSIS — G5751 Tarsal tunnel syndrome, right lower limb: Secondary | ICD-10-CM | POA: Diagnosis not present

## 2019-07-07 DIAGNOSIS — M7661 Achilles tendinitis, right leg: Secondary | ICD-10-CM

## 2019-07-07 DIAGNOSIS — M25774 Osteophyte, right foot: Secondary | ICD-10-CM

## 2019-07-07 DIAGNOSIS — M7731 Calcaneal spur, right foot: Secondary | ICD-10-CM

## 2019-07-07 MED ORDER — OXYCODONE-ACETAMINOPHEN 5-325 MG PO TABS: 1.0000 | ORAL_TABLET | ORAL | 0 refills | Status: DC | PRN

## 2019-07-07 NOTE — Progress Notes (Signed)
.  postop

## 2019-07-13 ENCOUNTER — Ambulatory Visit (INDEPENDENT_AMBULATORY_CARE_PROVIDER_SITE_OTHER): Payer: 59 | Admitting: Podiatry

## 2019-07-13 ENCOUNTER — Ambulatory Visit (INDEPENDENT_AMBULATORY_CARE_PROVIDER_SITE_OTHER): Payer: 59

## 2019-07-13 ENCOUNTER — Other Ambulatory Visit: Payer: Self-pay

## 2019-07-13 DIAGNOSIS — Z9889 Other specified postprocedural states: Secondary | ICD-10-CM

## 2019-07-13 DIAGNOSIS — M7731 Calcaneal spur, right foot: Secondary | ICD-10-CM | POA: Diagnosis not present

## 2019-07-13 MED ORDER — OXYCODONE-ACETAMINOPHEN 5-325 MG PO TABS: 1.0000 | ORAL_TABLET | ORAL | 0 refills | Status: DC | PRN

## 2019-07-17 NOTE — Progress Notes (Signed)
   Subjective:  Patient presents today status post retrocalcaneal exostectomy with repair of Achilles right. DOS: 115/2020. He states he is doing well. He reports some intermittent pain but states it is manageable with the Percocet. He has been nonweightbearing using crutches as directed. There are no worsening factors noted. Patient is here for further evaluation and treatment.    No past medical history on file.    Objective/Physical Exam Neurovascular status intact. Cast intact.   Radiographic Exam:  Osteotomies sites appear to be stable with routine healing.  Assessment: 1. s/p retrocalcaneal exostectomy with repair of Achilles right. DOS: 07/07/2019   Plan of Care:  1. Patient was evaluated. X-rays reviewed 2. Cast left intact.  3. Continue nonweightbearing with crutches.  4. Refill prescription for Percocet 5/325 mg provided to patient.  5. Return to clinic in one week for cast removal.    Edrick Kins, DPM [facility]  Dr. Edrick Kins, [place]                                        [place], [place] [address]                Office [phone]  Fax [phone]

## 2019-07-20 ENCOUNTER — Other Ambulatory Visit: Payer: Self-pay

## 2019-07-20 ENCOUNTER — Ambulatory Visit (INDEPENDENT_AMBULATORY_CARE_PROVIDER_SITE_OTHER): Payer: 59 | Admitting: Podiatry

## 2019-07-20 DIAGNOSIS — M7661 Achilles tendinitis, right leg: Secondary | ICD-10-CM

## 2019-07-20 DIAGNOSIS — Z9889 Other specified postprocedural states: Secondary | ICD-10-CM

## 2019-07-20 DIAGNOSIS — M7731 Calcaneal spur, right foot: Secondary | ICD-10-CM

## 2019-07-27 NOTE — Progress Notes (Signed)
   Subjective:  Patient presents today status post retrocalcaneal exostectomy with repair of Achilles right. DOS: 115/2020. He states he is doing well. He has been nonweightbearing as directed and states his cast is still intact with no issues. He denies modifying factors. Patient is here for further evaluation and treatment.    No past medical history on file.    Objective/Physical Exam Neurovascular status intact.  Skin incisions appear to be well coapted with sutures and staples intact. No sign of infectious process noted. No dehiscence. No active bleeding noted. Moderate edema noted to the surgical extremity.   Assessment: 1. s/p retrocalcaneal exostectomy with repair of Achilles right. DOS: 07/07/2019   Plan of Care:  1. Patient was evaluated.  2. Cast removed.  3. CAM boot dispensed. Continue nonweightbearing.  4. Return to clinic in 2 weeks to begin physical therapy and for suture removal.    Edrick Kins, DPM [facility]  Dr. Edrick Kins, [place]                                        [place], [place] [address]                Office [phone]  Fax [phone]

## 2019-08-03 ENCOUNTER — Ambulatory Visit (INDEPENDENT_AMBULATORY_CARE_PROVIDER_SITE_OTHER): Payer: 59 | Admitting: Podiatry

## 2019-08-03 ENCOUNTER — Ambulatory Visit (INDEPENDENT_AMBULATORY_CARE_PROVIDER_SITE_OTHER): Payer: 59

## 2019-08-03 ENCOUNTER — Other Ambulatory Visit: Payer: Self-pay | Admitting: Podiatry

## 2019-08-03 ENCOUNTER — Other Ambulatory Visit: Payer: Self-pay

## 2019-08-03 ENCOUNTER — Encounter: Payer: Self-pay | Admitting: Podiatry

## 2019-08-03 DIAGNOSIS — M7731 Calcaneal spur, right foot: Secondary | ICD-10-CM

## 2019-08-03 DIAGNOSIS — Z9889 Other specified postprocedural states: Secondary | ICD-10-CM

## 2019-08-03 DIAGNOSIS — M7661 Achilles tendinitis, right leg: Secondary | ICD-10-CM

## 2019-08-04 ENCOUNTER — Encounter: Payer: Self-pay | Admitting: Podiatry

## 2019-08-09 NOTE — Progress Notes (Signed)
   Subjective:  Patient presents today status post retrocalcaneal exostectomy with repair of Achilles right. DOS: 115/2020. He states he is doing well. He denies any significant pain or modifying factors. He has been nonweightbearing in the CAM boot as directed. Patient is here for further evaluation and treatment.   History reviewed. No pertinent past medical history.    Objective/Physical Exam Neurovascular status intact.  Skin incisions appear to be well coapted with sutures and staples intact. No sign of infectious process noted. No dehiscence. No active bleeding noted. Moderate edema noted to the surgical extremity.  Radiographic Exam:  Osteotomies sites appear to be stable with routine healing.   Assessment: 1. s/p retrocalcaneal exostectomy with repair of Achilles right. DOS: 07/07/2019   Plan of Care:  1. Patient was evaluated. X-Rays reviewed.  2. Sutures removed.  3. Begin weightbearing in CAM boot.  4. Physical therapy ordered three times weekly.  5. Begin ROM exercises.  6. Return to clinic in 4 weeks.     Edrick Kins, DPM [facility]  Dr. Edrick Kins, [place]                                        [place], [place] [address]                Office [phone]  Fax [phone]

## 2019-09-14 ENCOUNTER — Other Ambulatory Visit: Payer: Self-pay

## 2019-09-14 ENCOUNTER — Encounter: Payer: Self-pay | Admitting: Podiatry

## 2019-09-14 ENCOUNTER — Ambulatory Visit (INDEPENDENT_AMBULATORY_CARE_PROVIDER_SITE_OTHER): Payer: 59 | Admitting: Podiatry

## 2019-09-14 DIAGNOSIS — Z9889 Other specified postprocedural states: Secondary | ICD-10-CM

## 2019-09-14 DIAGNOSIS — M7731 Calcaneal spur, right foot: Secondary | ICD-10-CM

## 2019-09-14 DIAGNOSIS — M7661 Achilles tendinitis, right leg: Secondary | ICD-10-CM

## 2019-09-17 NOTE — Progress Notes (Signed)
   Subjective:  Patient presents today status post retrocalcaneal exostectomy with repair of Achilles right. DOS: 115/2020. He states he is doing well and improving. He reports some mild soreness and swelling to the posterior heel but has not needed to take any pain medication. He states wearing the boot helps alleviate the symptoms. Patient is here for further evaluation and treatment.   No past medical history on file.    Objective/Physical Exam Neurovascular status intact.  Skin incisions appear to be well coapted. No sign of infectious process noted. No dehiscence. No active bleeding noted. Moderate edema noted to the surgical extremity.   Assessment: 1. s/p retrocalcaneal exostectomy with repair of Achilles right. DOS: 07/07/2019   Plan of Care:  1. Patient was evaluated.   2. Continue physical therapy twice weekly.  3. Discontinue using CAM boot. Recommended good shoe gear.  4. Return to work 10/03/2019 with full activity with no restrictions.  5. Return to clinic as needed.   Works for city. Dump truck Geophysicist/field seismologist.     Edrick Kins, DPM [facility]  Dr. Edrick Kins, [place]                                        [place], [place] [address]                Office [phone]  Fax [phone]

## 2020-02-19 IMAGING — US US THYROID
1 series · 14 of 25 positions shown · non-contrast
Comparison: None.

CLINICAL DATA: Enlarged thyroid on exam

EXAM:
THYROID ULTRASOUND
TECHNIQUE: Ultrasound examination of the thyroid gland and adjacent soft
tissues was performed.

[Series 1: us thyroid · 0.07mm/px · 14 of 35 slices shown]
[im 1/35]
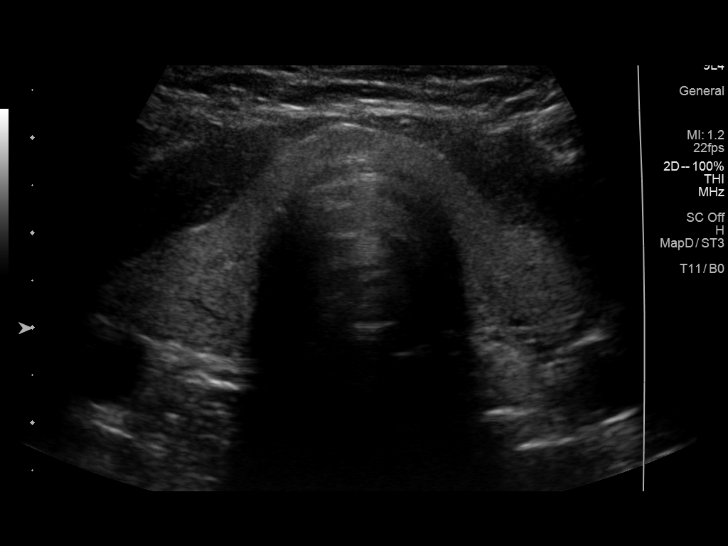
[im 3/35]
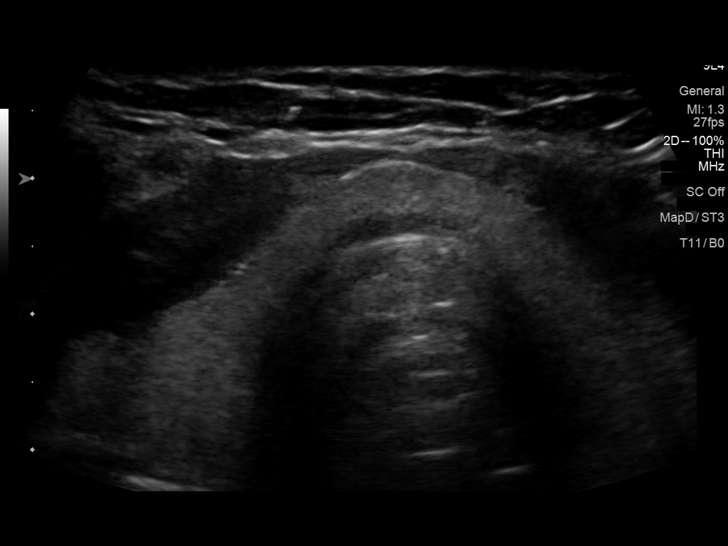
[im 6/35]
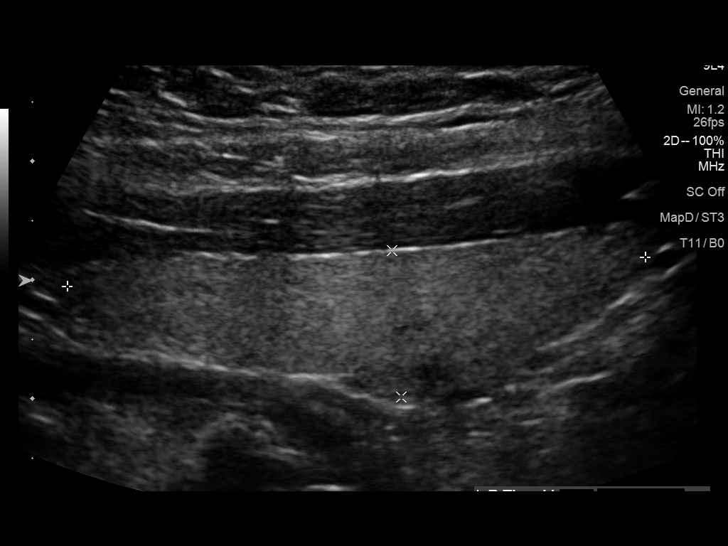
[im 9/35]
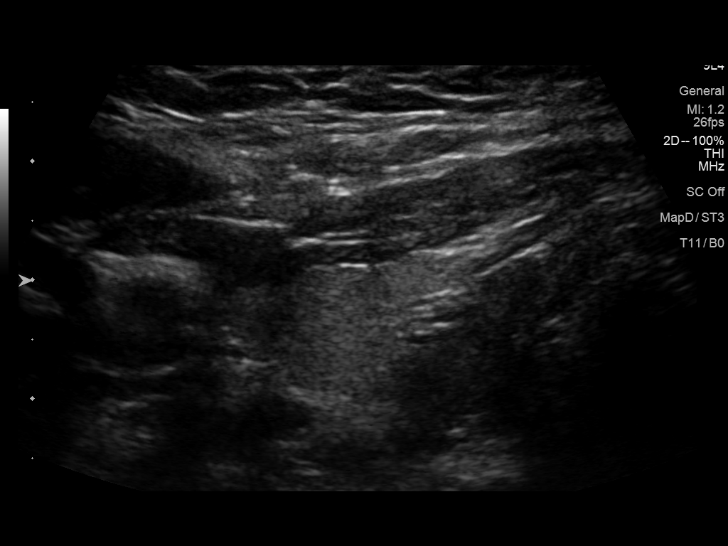
[im 12/35]
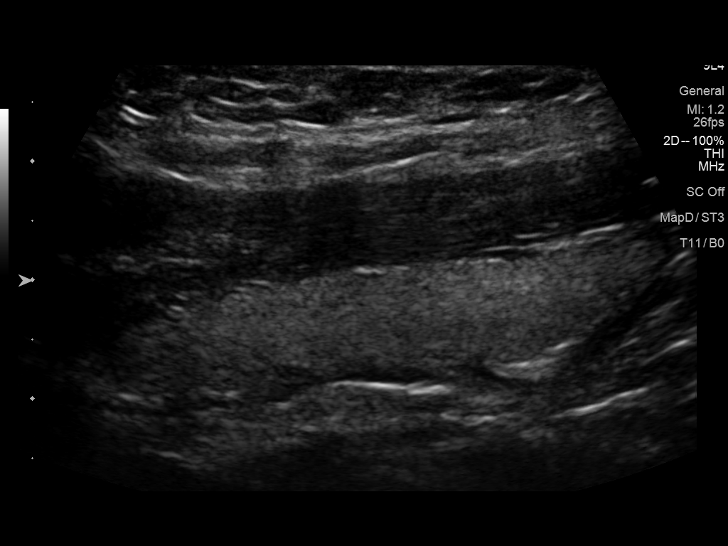
[im 13/35]
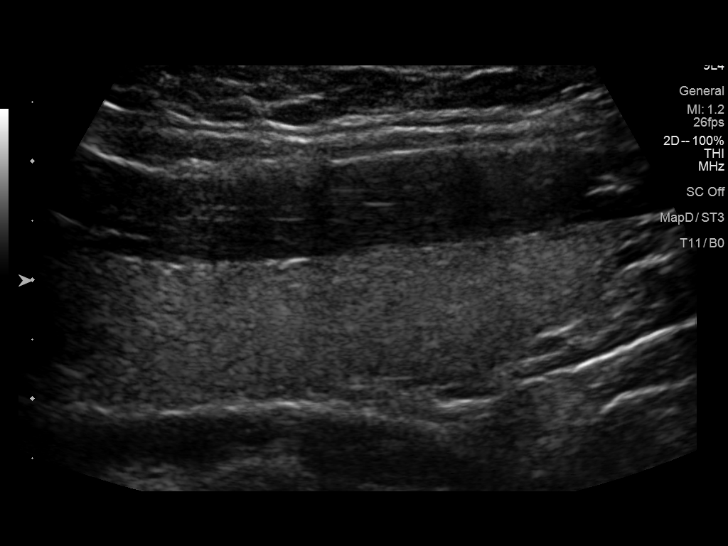
[im 16/35]
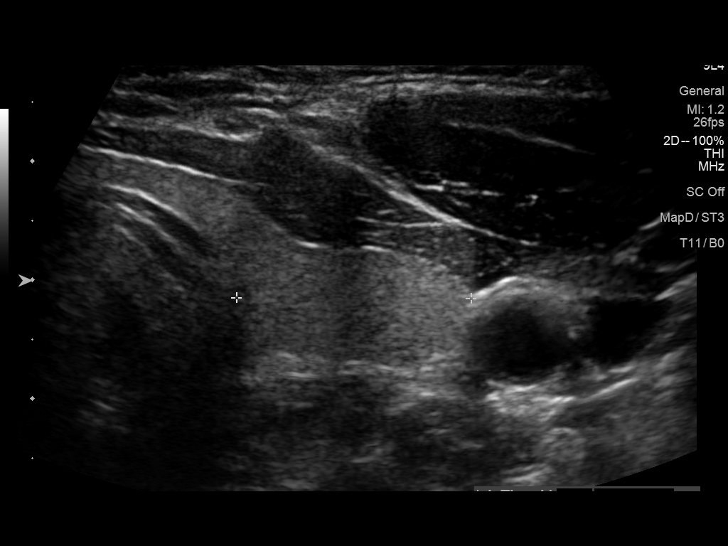
[im 19/35]
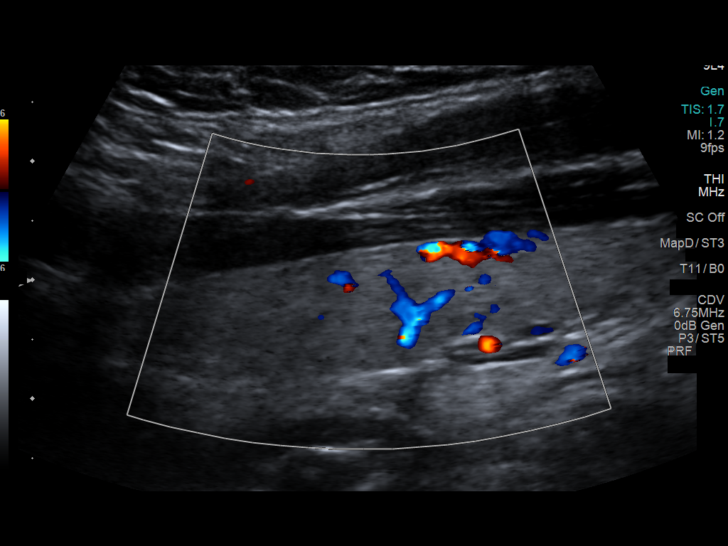
[im 22/35]
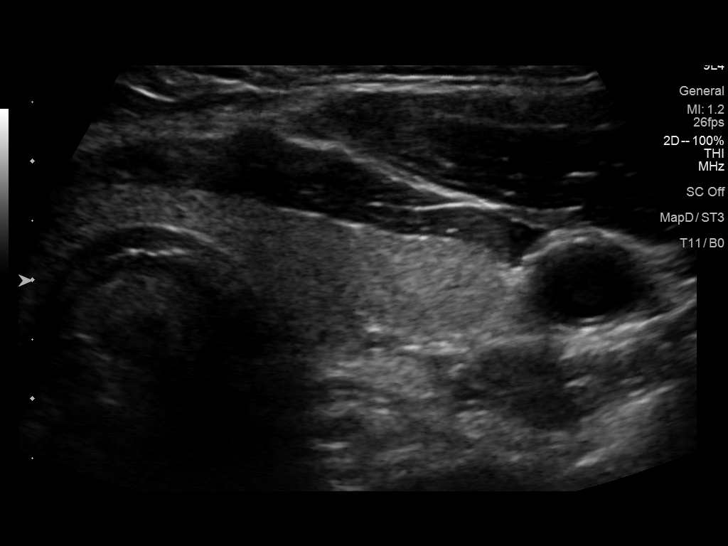
[im 23/35]
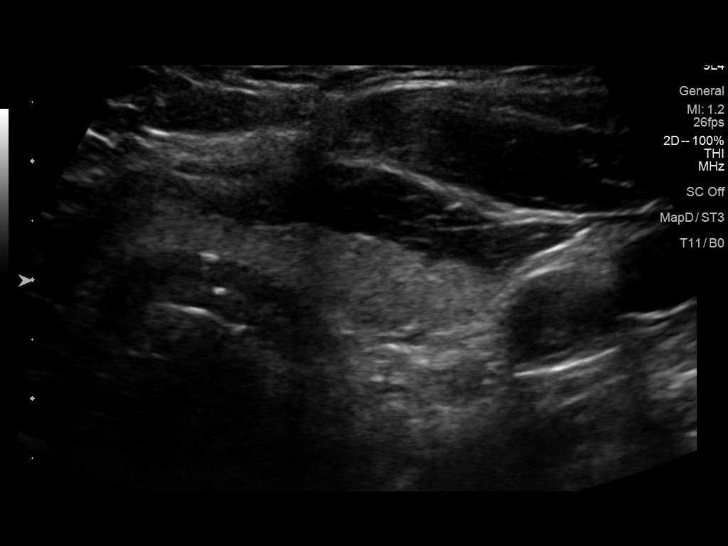
[im 26/35]
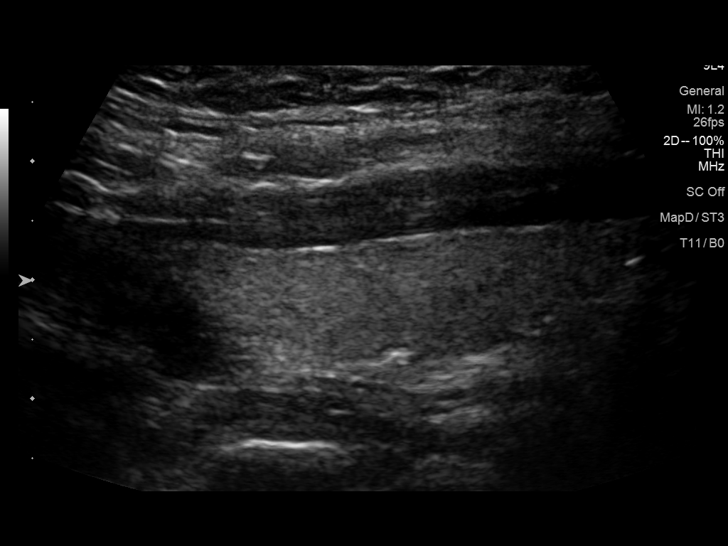
[im 29/35]
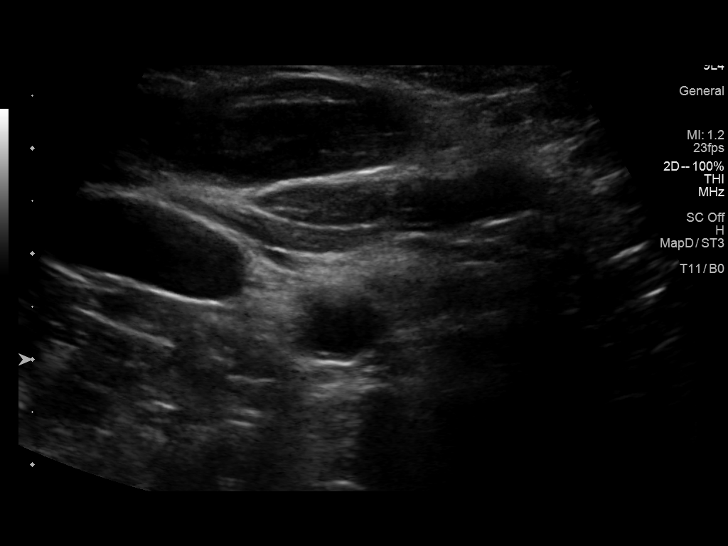
[im 32/35]
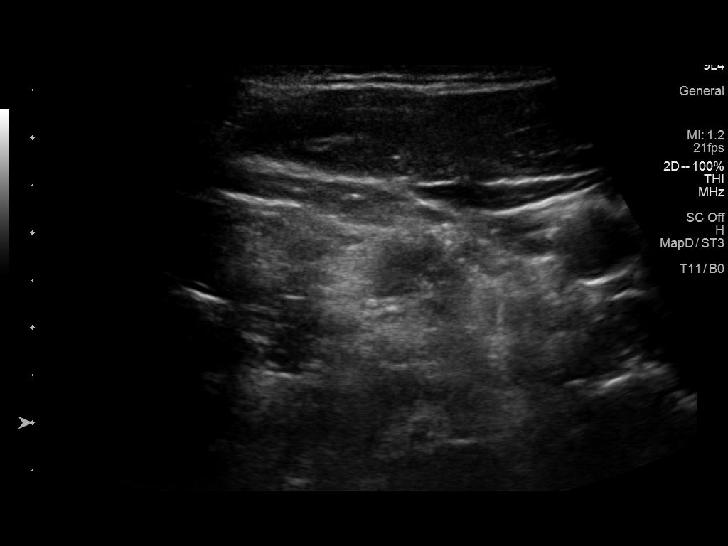
[im 35/35]
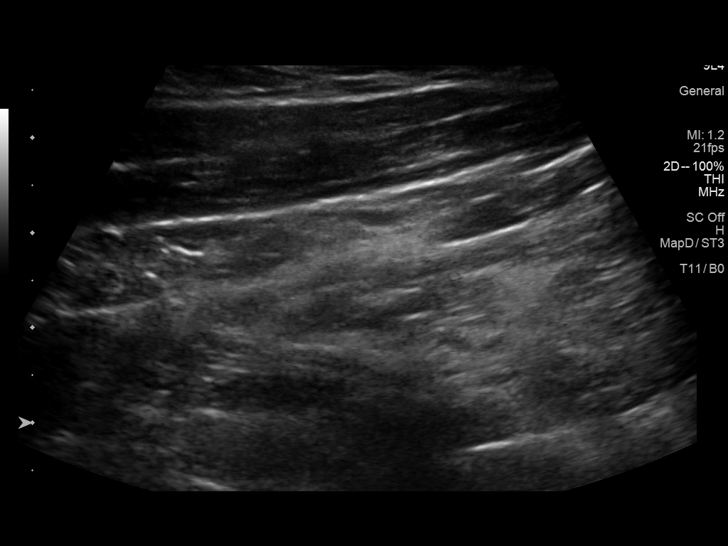

[14 of 25 positions shown; findings below may reference images not displayed]

FINDINGS: Parenchymal Echotexture: Normal

Isthmus: 5 mm

Right lobe: 4.9 x 1.2 x 2.0 cm

Left lobe: 4.8 x 1.0 x 2.0 cm

_________________________________________________________

Estimated total number of nodules >/= 1 cm: 0

Number of spongiform nodules >/=  2 cm not described below (TR1): 0

Number of mixed cystic and solid nodules >/= 1.5 cm not described
below (TR2): 0

_________________________________________________________

No discrete nodules are seen within the thyroid gland.
IMPRESSION: Normal thyroid ultrasound

The above is in keeping with the ACR TI-RADS recommendations - [HOSPITAL] 4387;[DATE].

## 2020-11-23 ENCOUNTER — Other Ambulatory Visit: Payer: Self-pay | Admitting: Nurse Practitioner

## 2020-11-23 ENCOUNTER — Ambulatory Visit
Admission: RE | Admit: 2020-11-23 | Discharge: 2020-11-23 | Disposition: A | Discharge status: Home / Self Care | Payer: Worker's Compensation | Source: Ambulatory Visit | Attending: Nurse Practitioner | Admitting: Nurse Practitioner

## 2020-11-23 ENCOUNTER — Other Ambulatory Visit: Payer: Self-pay

## 2020-11-23 DIAGNOSIS — M25572 Pain in left ankle and joints of left foot: Secondary | ICD-10-CM

## 2021-07-04 ENCOUNTER — Encounter: Payer: Self-pay | Admitting: Gastroenterology

## 2021-07-22 ENCOUNTER — Other Ambulatory Visit: Payer: Self-pay | Admitting: Nurse Practitioner

## 2021-07-22 ENCOUNTER — Other Ambulatory Visit: Payer: Self-pay

## 2021-07-22 ENCOUNTER — Ambulatory Visit
Admission: RE | Admit: 2021-07-22 | Discharge: 2021-07-22 | Disposition: A | Discharge status: Home / Self Care | Payer: Worker's Compensation | Source: Ambulatory Visit | Attending: Nurse Practitioner | Admitting: Nurse Practitioner

## 2021-07-22 DIAGNOSIS — R52 Pain, unspecified: Secondary | ICD-10-CM

## 2021-08-30 ENCOUNTER — Ambulatory Visit (AMBULATORY_SURGERY_CENTER): Payer: 59 | Admitting: *Deleted

## 2021-08-30 ENCOUNTER — Other Ambulatory Visit: Payer: Self-pay

## 2021-08-30 VITALS — Ht 75.0 in | Wt 315.0 lb

## 2021-08-30 DIAGNOSIS — Z1211 Encounter for screening for malignant neoplasm of colon: Secondary | ICD-10-CM

## 2021-08-30 MED ORDER — NA SULFATE-K SULFATE-MG SULF 17.5-3.13-1.6 GM/177ML PO SOLN: 1.0000 | Freq: Once | ORAL | 0 refills | Status: AC

## 2021-08-30 NOTE — Progress Notes (Signed)
PV completed over the phone. Pt verified name, DOB, address and insurance during PV today.  Pt mailed instruction packet with copy of consent form to read and not return, and instructions.   Pt encouraged to call with questions or issues.   Emailed instructions [email or web address] and mailed   No egg or soy allergy known to patient  No issues known to pt with past sedation with any surgeries or procedures Patient denies ever being told they had issues or difficulty with intubation  No FH of Malignant Hyperthermia Pt is not on diet pills Pt is not on  home 02  Pt is not on blood thinners  Pt denies issues with constipation  No A fib or A flutter  Pt is not vaccinated  for Covid    NO PA's for preps discussed with pt In PV today  Discussed with pt there will be an out-of-pocket cost for prep and that varies from $0 to 70 +  dollars - pt verbalized understanding   Due to the COVID-19 pandemic we are asking patients to follow certain guidelines in PV and the [facility]   Pt aware of COVID protocols and [facility] guidelines

## 2021-09-10 ENCOUNTER — Encounter: Payer: Self-pay | Admitting: Gastroenterology

## 2021-09-13 ENCOUNTER — Encounter: Payer: Self-pay | Admitting: Gastroenterology

## 2021-09-13 ENCOUNTER — Ambulatory Visit (AMBULATORY_SURGERY_CENTER): Payer: 59 | Admitting: Gastroenterology

## 2021-09-13 ENCOUNTER — Other Ambulatory Visit: Payer: Self-pay

## 2021-09-13 VITALS — BP 127/60 | HR 80 | Temp 98.9°F | Resp 15

## 2021-09-13 DIAGNOSIS — K621 Rectal polyp: Secondary | ICD-10-CM

## 2021-09-13 DIAGNOSIS — Z1211 Encounter for screening for malignant neoplasm of colon: Secondary | ICD-10-CM | POA: Diagnosis present

## 2021-09-13 DIAGNOSIS — D125 Benign neoplasm of sigmoid colon: Secondary | ICD-10-CM

## 2021-09-13 DIAGNOSIS — D128 Benign neoplasm of rectum: Secondary | ICD-10-CM

## 2021-09-13 DIAGNOSIS — Z800 Family history of malignant neoplasm of digestive organs: Secondary | ICD-10-CM | POA: Diagnosis not present

## 2021-09-13 DIAGNOSIS — D120 Benign neoplasm of cecum: Secondary | ICD-10-CM | POA: Diagnosis not present

## 2021-09-13 DIAGNOSIS — K640 First degree hemorrhoids: Secondary | ICD-10-CM

## 2021-09-13 DIAGNOSIS — D124 Benign neoplasm of descending colon: Secondary | ICD-10-CM | POA: Diagnosis not present

## 2021-09-13 MED ORDER — SODIUM CHLORIDE 0.9 % IV SOLN: 500.0000 mL | Freq: Once | INTRAVENOUS | Status: DC

## 2021-09-13 NOTE — Progress Notes (Signed)
C.W. vital signs. 

## 2021-09-13 NOTE — Progress Notes (Signed)
Called to room to assist during endoscopic procedure.  Patient ID and intended procedure confirmed with present staff. Received instructions for my participation in the procedure from the performing physician.  

## 2021-09-13 NOTE — Progress Notes (Signed)
No problems noted in the recovery room. maw 

## 2021-09-13 NOTE — Progress Notes (Signed)
Pt's states no medical or surgical changes since previsit or office visit. 

## 2021-09-13 NOTE — Progress Notes (Signed)
GASTROENTEROLOGY PROCEDURE H&P NOTE   Primary Care Physician: Merrilee Seashore, MD    Reason for Procedure:  Colon Cancer screening  Plan:    Colonoscopy  Patient is appropriate for endoscopic procedure(s) in the ambulatory ([facility]) setting.  The nature of the procedure, as well as the risks, benefits, and alternatives were carefully and thoroughly reviewed with the patient. Ample time for discussion and questions allowed. The patient understood, was satisfied, and agreed to proceed.     HPI: Derek Delacruz is a 51 y.o. male who presents for colonoscopy for routine Colon Cancer screening.  No active GI symptoms.  Fhx n/f father with CRC prior to age 32. Patient is otherwise without complaints or active issues today.  Past Medical History:  Diagnosis Date   Arthritis    mild, no meds   GERD (gastroesophageal reflux disease)    past hx   Hypertension    past hx - no meds currently   Pre-diabetes     Past Surgical History:  Procedure Laterality Date   FOOT SURGERY Right    TENDON REPAIR Left 06/09/2013   Procedure: Irrigation and debridement left thumb laceration;  Surgeon: Tennis Must, MD;  Location: [facility];  Service: Orthopedics;  Laterality: Left;   WOUND EXPLORATION Left 06/09/2013   Procedure: WOUND EXPLORATION;  Surgeon: Tennis Must, MD;  Location: [facility];  Service: Orthopedics;  Laterality: Left;    Prior to Admission medications   Medication Sig Start Date End Date Taking? Authorizing Provider  Multiple Vitamins-Minerals (MULTIVITAMIN MEN 50+ PO) Take by mouth.   Yes [provider]    Current Outpatient Medications  Medication Sig Dispense Refill   Multiple Vitamins-Minerals (MULTIVITAMIN MEN 50+ PO) Take by mouth.     Current Facility-Administered Medications  Medication Dose Route Frequency Provider Last Rate Last Admin   0.9 %  sodium chloride infusion  500 mL Intravenous Once Jackalyn Haith V, DO        Allergies as of 09/13/2021    (No Known Allergies)    Family History  Problem Relation Age of Onset   Colon cancer Neg Hx    Colon polyps Neg Hx    Esophageal cancer Neg Hx    Rectal cancer Neg Hx    Stomach cancer Neg Hx     Social History   Socioeconomic History   Marital status: Single    Spouse name: Not on file   Number of children: Not on file   Years of education: Not on file   Highest education level: Not on file  Occupational History   Not on file  Tobacco Use   Smoking status: Never   Smokeless tobacco: Never  Substance and Sexual Activity   Alcohol use: Yes    Comment: social   Drug use: No   Sexual activity: Not on file  Other Topics Concern   Not on file  Social History Narrative   Not on file   Social Determinants of Health   Financial Resource Strain: Not on file  Food Insecurity: Not on file  Transportation Needs: Not on file  Physical Activity: Not on file  Stress: Not on file  Social Connections: Not on file  Intimate Partner Violence: Not on file    Physical Exam: Vital signs in last 24 hours: @Temp  98.9 F (37.2 C)  GEN: NAD EYE: Sclerae anicteric ENT: MMM CV: Non-tachycardic Pulm: CTA b/l GI: Soft, NT/ND NEURO:  Alert & Oriented x S[place], DO  [facility]   09/13/2021 7:50 AM

## 2021-09-13 NOTE — Patient Instructions (Addendum)
Handouts were given to your care partner on polyps and hemorrhoids. You may resume your current medications today. Await biopsy results.  May take 1-3 weeks to receive pathology results. Please call if any questions or concerns.      YOU HAD AN ENDOSCOPIC PROCEDURE TODAY AT [facility]:   Refer to the procedure report that was given to you for any specific questions about what was found during the examination.  If the procedure report does not answer your questions, please call your gastroenterologist to clarify.  If you requested that your care partner not be given the details of your procedure findings, then the procedure report has been included in a sealed envelope for you to review at your convenience later.  YOU SHOULD EXPECT: Some feelings of bloating in the abdomen. Passage of more gas than usual.  Walking can help get rid of the air that was put into your GI tract during the procedure and reduce the bloating. If you had a lower endoscopy (such as a colonoscopy or flexible sigmoidoscopy) you may notice spotting of blood in your stool or on the toilet paper. If you underwent a bowel prep for your procedure, you may not have a normal bowel movement for a few days.  Please Note:  You might notice some irritation and congestion in your nose or some drainage.  This is from the oxygen used during your procedure.  There is no need for concern and it should clear up in a day or so.  SYMPTOMS TO REPORT IMMEDIATELY:  Following lower endoscopy (colonoscopy or flexible sigmoidoscopy):  Excessive amounts of blood in the stool  Significant tenderness or worsening of abdominal pains  Swelling of the abdomen that is new, acute  Fever of 100F or higher  For urgent or emergent issues, a gastroenterologist can be reached at any hour by calling [phone]. Do not use MyChart messaging for urgent concerns.    DIET:  We do recommend a small meal at first, but then you may proceed to  your regular diet.  Drink plenty of fluids but you should avoid alcoholic beverages for 24 hours.  ACTIVITY:  You should plan to take it easy for the rest of today and you should NOT DRIVE or use heavy machinery until tomorrow (because of the sedation medicines used during the test).    FOLLOW UP: Our staff will call the number listed on your records 48-72 hours following your procedure to check on you and address any questions or concerns that you may have regarding the information given to you following your procedure. If we do not reach you, we will leave a message.  We will attempt to reach you two times.  During this call, we will ask if you have developed any symptoms of COVID 19. If you develop any symptoms (ie: fever, flu-like symptoms, shortness of breath, cough etc.) before then, please call [phone].  If you test positive for Covid 19 in the 2 weeks post procedure, please call and report this information to [place].    If any biopsies were taken you will be contacted by phone or by letter within the next 1-3 weeks.  Please call us at [phone] if you have not heard about the biopsies in 3 weeks.    SIGNATURES/CONFIDENTIALITY: You and/or your care partner have signed paperwork which will be entered into your electronic medical record.  These signatures attest to the fact that that the information above on your After Visit Summary  has been reviewed and is understood.  Full responsibility of the confidentiality of this discharge information lies with you and/or your care-partner.

## 2021-09-13 NOTE — Progress Notes (Signed)
To Pacu, VSS. Report to [email or web address] 

## 2021-09-13 NOTE — Op Note (Signed)
[place] Patient Name: Derek Delacruz Procedure Date: 09/13/2021 8:28 AM MRN: [id number] Endoscopist: Gerrit Heck , MD Age: 51 Referring MD:  Date of Birth: 1971/07/09 Gender: Male Account #: [id number] Procedure:                Colonoscopy Indications:              Screening in patient at increased risk: Colorectal                            cancer in father before age 14, This is the                            patient's first colonoscopy. Otherwise, no GI                            symptoms. Medicines:                Monitored Anesthesia Care Procedure:                Pre-Anesthesia Assessment:                           - Prior to the procedure, a History and Physical                            was performed, and patient medications and                            allergies were reviewed. The patient's tolerance of                            previous anesthesia was also reviewed. The risks                            and benefits of the procedure and the sedation                            options and risks were discussed with the patient.                            All questions were answered, and informed consent                            was obtained. Prior Anticoagulants: The patient has                            taken no previous anticoagulant or antiplatelet                            agents. ASA Grade Assessment: II - A patient with                            mild systemic disease. After reviewing the risks  and benefits, the patient was deemed in                            satisfactory condition to undergo the procedure.                           After obtaining informed consent, the colonoscope                            was passed under direct vision. Throughout the                            procedure, the patient's blood pressure, pulse, and                            oxygen saturations were monitored continuously. The                             Olympus CF-HQ190L [id number]) Colonoscope was                            introduced through the anus and advanced to the the                            terminal ileum. The colonoscopy was performed                            without difficulty. The patient tolerated the                            procedure well. The quality of the bowel                            preparation was good. The terminal ileum, ileocecal                            valve, appendiceal orifice, and rectum were                            photographed. Scope In: 8:34:44 AM Scope Out: 8:54:49 AM Scope Withdrawal Time: 0 hours 18 minutes 9 seconds  Total Procedure Duration: 0 hours 20 minutes 5 seconds  Findings:                 The perianal and digital rectal examinations were                            normal.                           Seven sessile polyps were found in the sigmoid                            colon (5), descending colon (1), and cecum (1). The  polyps were 3 to 5 mm in size. These polyps were                            removed with a cold snare. Resection and retrieval                            were complete. Estimated blood loss was minimal.                           Three sessile polyps were found in the rectum. The                            polyps were 3 to 5 mm in size. These polyps were                            removed with a cold snare. Resection and retrieval                            were complete. Estimated blood loss was minimal.                           Non-bleeding internal hemorrhoids were found during                            retroflexion. The hemorrhoids were small.                           The terminal ileum appeared normal. Complications:            No immediate complications. Estimated Blood Loss:     Estimated blood loss was minimal. Impression:               - Seven 3 to 5 mm polyps in the sigmoid colon, in                             the descending colon and in the cecum, removed with                            a cold snare. Resected and retrieved.                           - Three 3 to 5 mm polyps in the rectum, removed                            with a cold snare. Resected and retrieved.                           - Non-bleeding internal hemorrhoids.                           - The examined portion of the ileum was normal. Recommendation:           - Patient has a contact number available for  emergencies. The signs and symptoms of potential                            delayed complications were discussed with the                            patient. Return to normal activities tomorrow.                            Written discharge instructions were provided to the                            patient.                           - Resume previous diet.                           - Continue present medications.                           - Await pathology results.                           - Repeat colonoscopy in 5 years for surveillance,                            or sooner based on pathology results.                           - Return to GI office PRN. Gerrit Heck, MD 09/13/2021 9:09:29 AM

## 2021-09-17 ENCOUNTER — Telehealth: Payer: Self-pay | Admitting: *Deleted

## 2021-09-17 NOTE — Telephone Encounter (Signed)
Attempted f/u phone call. No answer. Left message. °

## 2021-09-17 NOTE — Telephone Encounter (Signed)
Patient returned call, stated that he is doing well.

## 2021-09-17 NOTE — Telephone Encounter (Signed)
Pt called back and stated he was doing well.   Follow up Call-  Call back number 09/13/2021  Post procedure Call Back phone  # [phone]  Permission to leave phone message Yes  Some recent data might be hidden     Patient questions:  Do you have a fever, pain , or abdominal swelling? No. Pain Score  0 *  Have you tolerated food without any problems? Yes.    Have you been able to return to your normal activities? Yes.    Do you have any questions about your discharge instructions: Diet   No. Medications  No. Follow up visit  No.  Do you have questions or concerns about your Care? No.  Actions: * If pain score is 4 or above: No action needed, pain <4.

## 2021-09-24 ENCOUNTER — Encounter: Payer: Self-pay | Admitting: Gastroenterology

## 2022-04-01 ENCOUNTER — Other Ambulatory Visit: Payer: Self-pay | Admitting: Nurse Practitioner

## 2022-04-01 ENCOUNTER — Ambulatory Visit
Admission: RE | Admit: 2022-04-01 | Discharge: 2022-04-01 | Disposition: A | Discharge status: Home / Self Care | Payer: Worker's Compensation | Source: Ambulatory Visit | Attending: Nurse Practitioner | Admitting: Nurse Practitioner

## 2022-04-01 DIAGNOSIS — S93401A Sprain of unspecified ligament of right ankle, initial encounter: Secondary | ICD-10-CM

## 2024-01-27 ENCOUNTER — Ambulatory Visit
Admission: RE | Admit: 2024-01-27 | Discharge: 2024-01-27 | Disposition: A | Discharge status: Home / Self Care | Source: Ambulatory Visit | Attending: Nurse Practitioner | Admitting: Nurse Practitioner

## 2024-01-27 ENCOUNTER — Other Ambulatory Visit: Payer: Self-pay | Admitting: Nurse Practitioner

## 2024-01-27 DIAGNOSIS — S99911A Unspecified injury of right ankle, initial encounter: Secondary | ICD-10-CM

## 2024-01-27 DIAGNOSIS — S8992XA Unspecified injury of left lower leg, initial encounter: Secondary | ICD-10-CM

## 2024-02-19 ENCOUNTER — Telehealth: Payer: Self-pay | Admitting: Gastroenterology

## 2024-02-19 NOTE — Telephone Encounter (Signed)
 Good afternoon Dr. Karene Oto this patient called and stated that he was told to call back in 2- 3 years to have another colonoscopy due to patient having a lot of polyps. Patient is requesting to know if he can get the colonoscopy done sooner or does he have to wait for January of 2028. Please advise.

## 2024-04-20 ENCOUNTER — Ambulatory Visit
Admission: RE | Admit: 2024-04-20 | Discharge: 2024-04-20 | Disposition: A | Discharge status: Home / Self Care | Payer: Worker's Compensation | Source: Ambulatory Visit | Attending: Family Medicine | Admitting: Family Medicine

## 2024-04-20 ENCOUNTER — Other Ambulatory Visit: Payer: Self-pay | Admitting: Family Medicine

## 2024-04-20 DIAGNOSIS — R609 Edema, unspecified: Secondary | ICD-10-CM
